# Patient Record
Sex: Male | Born: 1990 | Race: Black or African American | Hispanic: No | Marital: Single | Smoking: Never smoker
Health system: Southern US, Community
[De-identification: ages and names within clinical notes are randomized; demographics above are authoritative.]

## PROBLEM LIST (undated history)

## (undated) DIAGNOSIS — L8990 Pressure ulcer of unspecified site, unspecified stage: Secondary | ICD-10-CM

## (undated) DIAGNOSIS — G3182 Leigh's disease: Secondary | ICD-10-CM

## (undated) DIAGNOSIS — L89152 Pressure ulcer of sacral region, stage 2: Secondary | ICD-10-CM

## (undated) DIAGNOSIS — Q8789 Other specified congenital malformation syndromes, not elsewhere classified: Secondary | ICD-10-CM

## (undated) HISTORY — PX: HEMANGIOMA EXCISION: SHX1734

## (undated) HISTORY — PX: GASTROSTOMY TUBE PLACEMENT: SHX655

---
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
## 2009-07-25 ENCOUNTER — Ambulatory Visit: Payer: Self-pay | Admitting: General Surgery

## 2009-10-17 ENCOUNTER — Ambulatory Visit: Payer: Self-pay | Admitting: General Surgery

## 2009-10-31 ENCOUNTER — Ambulatory Visit (HOSPITAL_COMMUNITY): Admission: RE | Admit: 2009-10-31 | Discharge: 2009-10-31 | Payer: Self-pay | Admitting: Orthopedic Surgery

## 2010-03-26 ENCOUNTER — Emergency Department (HOSPITAL_COMMUNITY)
Admission: EM | Admit: 2010-03-26 | Discharge: 2010-03-26 | Payer: Self-pay | Source: Home / Self Care | Admitting: Family Medicine

## 2010-12-24 LAB — CBC
HCT: 45.5 % (ref 39.0–52.0)
Hemoglobin: 15.9 g/dL (ref 13.0–17.0)
MCHC: 34.9 g/dL (ref 30.0–36.0)
MCV: 88 fL (ref 78.0–100.0)
Platelets: 148 10*3/uL — ABNORMAL LOW (ref 150–400)
RBC: 5.17 MIL/uL (ref 4.22–5.81)
RDW: 12.9 % (ref 11.5–15.5)
WBC: 7.1 10*3/uL (ref 4.0–10.5)

## 2011-02-23 NOTE — Consult Note (Signed)
Jason Glover, Jason Glover            ACCOUNT NO.:  [id number]   MEDICAL RECORD NO.:  [id number]          PATIENT TYPE:  INP   LOCATION:  6150                         FACILITY:  [facility]   PHYSICIAN:  Genene Churn. Love, M.D.    DATE OF BIRTH:  10-18-1990   DATE OF CONSULTATION:  11/05/2005  DATE OF DISCHARGE:                                   CONSULTATION   REFERRING PHYSICIAN:  Orie Rout, M.D.   REASON FOR CONSULTATION:  This 20 year old right-handed African American  male is seen at the request of the Pediatric Service for evaluation of known  history of mitochondrial encephalopathy and altered mental status.   HISTORY OF PRESENT ILLNESS:  Jason Glover was the 9-pound 10-ounce  product of a full-term pregnancy, delivered by C-section at [facility]  for bradycardia.  Developmental motor milestones were normal to about 20 year  of age, according to his mom, but at 47 months, he began having episodes of  slumping while riding in the car with his mom.  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  He did not have any seizures.  He did not have any visual  loss.  He began having episodes occurring as often as once per year to once  every other year of lethargy, drooling, dysarthria, dysphagia, quadriparesis  with limp muscle lasting as long as weeks to months and associated with  shortness of breath.  Over the last year he has had a deterioration in his  neurologic skills with inability to stand, the development of paraparesis,  and the need for a wheelchair, but he has been able to dress himself, feed  himself, bath himself, and take care of his toilet needs.  He is mentally  retarded, but has attended special education school.   His mother noted about 2 weeks ago that he began going into another one of  his episodes with decreased speech, lethargy, drooping, dysarthria, double  vision and shortness of  breath and was admitted with dehydration, an  elevated serum sodium to 151 and low CO2 content.  In the hospital he  required large volumes of fluid, up to 7 L, for his dehydration with  improvement in his dehydration, but has remained lethargic with decreased  speech, drooling, dysarthria, having dysphagia and generalized weakness.  On  chest x-ray, he has been found to have a pneumomediastinum.  His last blood  studies on November 04, 2005 revealed a sodium of 140, potassium 3.2,  chloride 113, CO2 content 22, glucose 119, BUN 11, creatinine 0.7 and a  calcium of 8.8.  A urine culture grew 80,000 colonies per milliliter of  multiple bacterial species present.  Sed rate was elevated at 25 on November 02, 2005.  Initial white blood cell count was 16,100, hemoglobin was 17.3,  hematocrit was 50.7 and platelet count was 242,000.  His initial sodium was  151, as mentioned, which went from 126-107 and has gone to 140, and  potassium of 3.2, as mentioned.  His liver function  tests have remained  normal in the hospital.  His urine ketones were initially 40 and  subsequently have become negative.  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   His medications at home have included Carnitor, iron, mega B, Clarinex,  Singulair, Flonase, albuterol for treatment of his asthma and MiraLax.   In the hospital, he has been found to have evidence of oral candidiasis.  He  history received Diflucan.  Other medicines in the hospital include ferrous  sulfate, Atarax, multivitamin and lactated Ringer's.   PHYSICAL EXAMINATION:  GENERAL:  Examination revealed a well-developed, thin  male who was at this time afebrile.  VITAL SIGNS:  His blood pressure in the right arm was 120/60; left arm was  110/60.  Heart rate was 100.  NEUROLOGIC:  He was alert, would look at the examiner and follow with  his  eyes.  He tried occasionally to mumble words, would hold up 1 or 2 fingers  with his hands at times; otherwise, he lay in bed with generalized decreased  tone, but some increased tone and flexion at the elbows, knees and heel  cords.  As mentioned, he would follow occasional commands.  His cranial  nerve examination revealed that he would count fingers with each eye.  Both  disks were seen and flat. There was no optic atrophy.  The extraocular  movements were full and he had corneals.  He had bifacial weakness.  He did  have gags.  He could hear.  His motor examination revealed decreased gone,  except for the flexion of his elbow and his knees and ankles.  His plantar  responses were bilaterally upgoing, but he had absent reflexes.  He could  feel pinprick in his upper and lower extremities.  He had decreased  vibration sense in his lower extremities.   IMPRESSION:  1.  Metabolic encephalopathy with history of dehydration, code 348.3.  2.  Leigh's disease or mitochondrial encephalopathy.  3.  History of asthma.  4.  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  5.  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   PLAN:  Plan at this time is to obtain metabolic studies or parameters for  evaluation of his mental status and then consult Dr. Sharene Skeans regarding  consideration of repeat MRI, which is frequently abnormal with Leigh's  disease, showing white matter disease.  His last MRI according to his mother  was many years ago.  Overall, he has had a definite progression over the  last year as well as acute episodes occurring about once per year and his  prognosis is not good.           ______________________________  Genene Churn. Sandria Manly, M.D.     JML/MEDQ  D:  11/05/2005  T:  11/06/2005  Job:  [id number]

## 2011-02-23 NOTE — Discharge Summary (Signed)
NAMEELUZER, HOWDESHELL NO.:  [id number]   MEDICAL RECORD NO.:  [id number]          PATIENT TYPE:  INP   LOCATION:  6150                         FACILITY:  [facility]   PHYSICIAN:  Dyann Ruddle, MDDATE OF BIRTH:  10/05/1991   DATE OF ADMISSION:  11/02/2005  DATE OF DISCHARGE:  12/07/2005                                 DISCHARGE SUMMARY   DISCHARGE DIAGNOSES:  1.  Myopathy, encephalopathy, lactic acidosis, and stroke-like episodes      syndrome (MELAS).  2.  Leigh's syndrome.  3.  Status post Nissen fundoplication/gastrostomy tube November 30, 2005.  4.  Oral candidiasis.  5.  Dehydration.  6.  Pneumomediastinum.  7.  Scoliosis.  8.  Enterococcus urinary tract infection, status post 10-day antibiotic      course.   DISCHARGE MEDICATIONS:  1.  Coenzyme Q10 one tablet daily.  2.  Multivitamin with iron one tablet daily per G-tube.  3.  Mega-B vitamin one tablet daily.  4.  Flonase one to two sprays each nostril daily.  5.  Carnitine 300 mg t.i.d. via G-tube.  6.  Hydroxyzine 30 mg daily via G-tube.  7.  Claritin 10 mg daily via G-tube.  8.  Nystatin 100,000 units swish and swallow four times a day.  9.  Singulair 10 mg daily via G-tube.  10. Tylenol 325 mg q.4h. p.r.n. via G-tube.  11. Fluconazole 100 mg daily x14 days via G-tube.  12. Miralax one-half capful four times daily via G-tube.  13. Magic Mouthwash with Xylocaine 5 mL four times daily.   HOSPITAL PROCEDURES:  1.  Chest x-ray on November 02, 2005, showed a pneumomediastinum.  2.  KUB on November 02, 2005, showed scoliosis but no signs of obstruction.  3.  Chest x-ray on November 06, 2005, showed decrease in the size of      pneumomediastinum.  4.  November 06, 2005, MRI of the brain showed a tiny suspected acute stroke      in the left putamen.  5.  November 07, 2005, PICC line placed via ultrasound and fluoroscopy.  6.  November 08, 2005, modified barium swallow was incomplete because the  patient could not participate.  7.  On November 12, 2005, MRI of the C-spine showed no cord compression or      abnormality of the cord.  8.  On December 03, 2005, the chest x-ray showed no active lung disease.  9.  On November 19, 2005, a KUB showed a mild ileus.  10. On November 19, 2005, a transthoracic echo showed normal cardiac      function.  11. On November 22, 2005, a barium swallow and upper GI showed minimal      aspiration with significant gastroesophageal reflux.  12. On February 23, a G-tube and Nissen performed.   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  He presented with hypernatremic dehydration as well as  pharyngitis and oral candidiasis.  The patient had not been taking food  or  water at home and had an altered mental status for one day.  This was a  significant decline from the patient's baseline.  Since he would not take  p.o., TPN was initiated on November 07, 2005.  The patient's ambulation had  significantly decreased from his baseline, as did his verbalization.  Neurology was consulted and felt that this decline may be progression of his  underlying syndromes and the MRI of the brain did not explain these acute  changes.  The patient eventually had TPN stopped and had a G-tube and Nissen  placed on February 23.  At discharge the patient was nonambulatory but  followed simple commands and nodded yes or no to most questions.  Due to  his overall decline, it was felt best by his mother and staff to have him  discharged home with hospice services from [facility].  He was also to  continue oral fluconazole for two more weeks.  The plaques in his mouth have  resolved by the day of discharge.  The patient had been treated for a total  of 32 days of IV fluconazole while in the hospital.   DISCHARGE INSTRUCTIONS AND FOLLOW-UP:  1.  He is to  follow up with Dr. Sharene Skeans in approximately one month.  2.  He is to follow up with Dr. Leeanne Mannan in one week to evaluate his G-tube.  3.  He is to take all medications as prescribed.  4.  He will have hospice services and [facility] services providing support      for the mother and family.     ______________________________  Pediatrics Resident    ______________________________  Dyann Ruddle, MD    PR/MEDQ  D:  12/07/2005  T:  12/08/2005  Job:  [id number]

## 2011-02-23 NOTE — Op Note (Signed)
Jason Glover, Jason Glover            ACCOUNT NO.:  [id number]   MEDICAL RECORD NO.:  [id number]          PATIENT TYPE:  INP   LOCATION:  6152                         FACILITY:  [facility]   PHYSICIAN:  Leonia Corona, M.D.  DATE OF BIRTH:  02/21/1991   DATE OF PROCEDURE:  DATE OF DISCHARGE:                                 OPERATIVE REPORT   PREOPERATIVE DIAGNOSES:  1.  Severe gastroesophageal reflux.  2.  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  3.  Mitochondrial encephalopathy.   POSTOPERATIVE DIAGNOSES:  1.  Severe gastroesophageal reflux.  2.  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  3.  Mitochondrial encephalopathy.   PROCEDURE PERFORMED:  1.  Nissen fundoplication.  2.  Feeding gastrostomy.   SURGEON:  Leonia Corona, M.D.   ASSISTANTDonnella Bi D. Pendse, M.D.   ANESTHESIA:  General endotracheal tube anesthesia.   INDICATION FOR THE PROCEDURE:  This 20 year old male child was evaluated for  a possible gastrostomy tube placement.  The patient was known to have severe  mental disability, causing progressive deterioration of the mental status  caused by mitochondrial encephalopathy.  The patient has not been able to  cope with his nutritional requirement and had swallowing difficulties.  The  patient was also found to have severe gastroesophageal reflux, hence the  indication for the procedure.   PROCEDURE IN DETAIL:  The patient was brought into the operating room and  placed supine on the operating table.  General endotracheal tube anesthesia  was given.  The abdomen was cleaned, prepped and draped in the usual manner  from nipple to pubis and one lateral side to the other.  A midline incision  that started at the xiphoid and ran up to the umbilicus was made on the  skin.  The incision was deepened through the subcutaneous tissues using  electrocautery.  The abdomen was opened along the linea alba with cautery.  Self-retaining retractors were applied to expose the GE  junction.  The rib  cage appeared to be a very narrow angle and the GE junction a very high up.  There was a significant amount of difficulty in reaching up to the left lobe  of the liver.  We realized that it may not be easy to divide the triangular  ligament to fold the left lobe of the liver; we therefore decided to retract  the liver and expose the GE junction.  The stomach was pulled downwards and  the GE junction was exposed.  The peritoneal reflection over the lower  esophagus was picked up with forceps and divided with scissors.  The  peritoneal reflection over the anterior esophagus was divided and the left  side of the esophagus was then dissected with peanuts, creating a plane  between the esophagus and the crura of the esophageal hiatus.  Blunt and  sharp dissection was carried out until the GE junction was clear on the left  side.  Similar blunt and sharp dissection was carried to the right of the  esophagus until the right index finger could be passed behind the esophagus.  We then placed a half-inch Penrose drain behind the  esophagus at GE junction  and a hemostat was applied to the Penrose drain to help pull the esophagus  and while doing the dissection around it.  A small window behind the  esophagus was carefully enlarged with blunt and sharp dissection, exposing  the crura.  There was a very ill-defined, poorly formed left crus, where as  the right crus was clearly well-defined and developed.  Some more dissection  was carried out on the lesser curvature of the stomach, dividing the  neurovascular bundle between clamps and ligating it with 2-0 silk.  Further  dissection was carried out along the fundus, dividing first the short  gastric vessels with a Harmonic scalpel; for additional safety, metal clips  were applied on the splenic side of the vessels.  After fair mobilization of  the fundus and the posterior wall of the stomach, we made an assessment if  the fundus could  be wrapped around the esophagus by passing it behind the  esophagus.  With the help of fingers, the fundus was pushed behind the  esophagus and with the help of a Babcock, it was delivered on the right side  of the esophagus; 2 Babcocks were applied, one on the left and one of the  right side of the esophagus over the fundus of the stomach to assess the  wrap; a good assessment was made.  At this point, the folded fundus of the  stomach was pulled back and wound was irrigated.  Some more definition of  the left and right crura was done before putting a stitch; an ill-defined  left crus and a well-defined right crus were obvious at this time.  A size  38 bougie was passed by the anesthetist across the GE junction.  A stitch  was taken on the left and right crura using pledgeted 2-0 silk and tied over  the bougie.  A single stitch was placed to tighten the hiatus.  After tying  the hiatal stitch, the bougie was withdrawn and the mobilized fundus of the  stomach was pushed behind the retroesophageal window and delivered from the  right side of the esophagus.  Two Babcocks were applied, one on the left  side and one on the right side of the esophagus, over the stomach and first  stitch was placed at the highest point on the left side on the fundus.  A  bite was taken on the esophagus, then over the peritoneal reflection on the  diaphragm and then on the right side of the fundus.  A pledget was inserted  through the needle before tying.  The second stitch was taken about a  centimeter below the first by starting from the portion of the stomach on  the left side of the esophagus, a bite on the esophagus and then on the  fundus on the right side of the esophagus, also a pledget was placed.  A  third stitch was 1 cm below the second starting from the portion of the  fundus on the left side of the esophagus, taking a bite at the GE junction  and then the folded fundus on the right side of the  esophagus.  A pledget was fed through the needle and then the stitches were tied.  About a 2-cm-  long 360-degree full wrap was obtained.  The third stitches were cut.  The  wound was irrigated; no active bleeders were noted.  At this point, the  retractors were removed and a site was chosen  on the anterior wall of the  stomach closer to the greater curvature.  A stay suture using 3-0 silk was  placed to hold the stomach up.  Two pursestring sutures were placed on the  anterior stomach wall, enclosing about a 1-cm circle in the center.  The  second pursestring suture was about 2 mm outside the inner circle and  tagged.  A small skin incision was made at the proposed gastrostomy site on  the abdominal wall in the left upper quadrant.  Tonsil forceps were placed  through the abdominal wall and into the abdomen and another hemostat was  passed from inside the abdomen to deliver the tip outside the abdominal wall  to grasp the 18-French gastrostomy tube, which was pulled through the  abdominal wall.  The tip was delivered inside the abdomen.  At this point, a  small opening was made on the anterior wall of the stomach in the center of  the pursestring suture using cautery.  The well-lubricated gastrostomy tube  already flush with the abdominal wall was introduced into the stomach and  balloon was inflated to 5 mL of saline.  The inner pursestring suture was  tied followed by the outer pursestring suture being tied and closing the  cuff of the stomach wall over the gastrostomy tube.  The stomach was then  tacked to the anterior abdominal wall using 3-0 silk; 3 stitches were  placed, 1 laterally and 2, right inferior and right superior, and these  stitches were tied while simultaneously the gastrotomy tube was pulled  outside on the abdominal wall, bringing the balloon, abutting the stomach  wall.  After tying these stitches, the stomach was well-anchored to the  parietal peritoneum.  The stomach  was filled with saline, no leak was noted  and the fluid was suctioned out.  Wound was irrigated once again.  The GE  junction was inspected for any acute bleeding or oozing; none was noted.  At  this point, the bougie was withdrawn and a nasogastric tube was placed,  confirmed by palpation, and abdomen was closed in a single layer using 2-0  Vicryl interrupted fashion.  Approximately 20 mL of 0.25% Marcaine with  epinephrine were infiltrated in and around the incision for postoperative  pain control.  The  skin was approximated using skin metal clips.  Wound was clean and dried.  A  sterile gauze dressing was applied.  The patient tolerated the procedure  well, which was smooth and uneventful.  The patient was later extubated and  transported to the recovery room in good and stable condition.      Leonia Corona, M.D.  Electronically Signed     SF/MEDQ  D:  11/30/2005  T:  12/01/2005  Job:  [id number]   cc:   Orie Rout, M.D.  Fax: [phone]

## 2011-02-23 NOTE — Op Note (Signed)
[name], Jason Glover            ACCOUNT NO.:  [id number]   MEDICAL RECORD NO.:  [id number]          PATIENT TYPE:  AMB   LOCATION:  ENDO                         FACILITY:  [facility]   PHYSICIAN:  Leonia Corona, M.D.  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   PROCEDURE:  Replacement of feeding gastrostomy with G-button.  The procedure  was performed in the endoscopy suite without anesthesia.   SURGEON:  Leonia Corona, M.D.   ASSISTANT:  Nurse.   INDICATIONS FOR PROCEDURE:  This 20 year old male child was operated a month  and a half ago for severe nutritional failure requiring a Nissen and feeding  gastrostomy.  The patient still has a surgically placed long gastrostomy  tube which needs to be replaced with G-button for convenient using.  Hence,  the indication for the procedure.   DESCRIPTION OF PROCEDURE:  The patient was placed supine on the bed in the  endoscopy suite.  The old gastrostomy tube was removed by deflating the  balloon.  It was pulled out without difficulty.  A new MIC G-button 18  French 2.7-cm stem was well-lubricated and introduced through the  gastrotomy, and the balloon was inflated to 4 ccH20.  It fitted very well.  The correct placement was confirmed by instilling water into the stomach.  We had a free flow and return of gastric content, confirming correct  placement.   The patient tolerated the procedure very well which was smooth and  uneventful.  The patient was later discharged to home with instructions to  follow routine care and use of gastrostomy button and follow up p.r.n.      Leonia Corona, M.D.  Electronically Signed     SF/MEDQ  D:  01/10/2006  T:  01/11/2006  Job:  [id number]   cc:   [place], M.D.  Fax: [phone]

## 2015-07-21 ENCOUNTER — Ambulatory Visit: Payer: Self-pay | Admitting: Physical Therapy

## 2015-08-03 ENCOUNTER — Emergency Department (HOSPITAL_COMMUNITY)
Admission: EM | Admit: 2015-08-03 | Discharge: 2015-08-03 | Disposition: A | Discharge status: Home / Self Care | Payer: BLUE CROSS/BLUE SHIELD | Attending: Emergency Medicine | Admitting: Emergency Medicine

## 2015-08-03 ENCOUNTER — Encounter (HOSPITAL_COMMUNITY): Payer: Self-pay | Admitting: Emergency Medicine

## 2015-08-03 DIAGNOSIS — T85598A Other mechanical complication of other gastrointestinal prosthetic devices, implants and grafts, initial encounter: Secondary | ICD-10-CM | POA: Insufficient documentation

## 2015-08-03 DIAGNOSIS — K9423 Gastrostomy malfunction: Secondary | ICD-10-CM

## 2015-08-03 DIAGNOSIS — Z79899 Other long term (current) drug therapy: Secondary | ICD-10-CM | POA: Insufficient documentation

## 2015-08-03 DIAGNOSIS — K9429 Other complications of gastrostomy: Secondary | ICD-10-CM

## 2015-08-03 DIAGNOSIS — Y733 Surgical instruments, materials and gastroenterology and urology devices (including sutures) associated with adverse incidents: Secondary | ICD-10-CM | POA: Insufficient documentation

## 2015-08-03 MED ORDER — LIDOCAINE HCL 2 % EX GEL
1.0000 "application " | Freq: Once | CUTANEOUS | Status: AC
  Administered 2015-08-03: 1 via TOPICAL
  Filled 2015-08-03: qty 20

## 2015-08-03 NOTE — ED Provider Notes (Signed)
CSN: [id number]     Arrival date & time 08/03/15  1455 History   First MD Initiated Contact with Patient 08/03/15 1653     Chief Complaint  Patient presents with  . g tube issue   . Back Pain   HPI   24 year old male with a history of metabolic encephalopathy with quadriparesis, status post G-tube placement presents today with G-tube complication. Mother reports that she regularly changes his G-tube at home and has done so for the past 9 years. She reports that over the last several weeks she's been unable to remove the G-tube on her own. She attempted following up with her pediatric surgeon who placed it, he is no longer in practice ( Dr. Wyline MoodWeiner). She is currently in contact with [facility] who will see the patient after appropriate paperwork is in place [facility]. She reports that everything is in order for patient to be seen but has to call in 2 days to schedule the evaluation. Mother reports the patient is able to tolerate some nutrition by mouth but does not believe it's enough to maintain adequate nutrition. She denies any signs of infection surrounding the stoma, no discharge, odor, or abnormal swelling. Denies any fever, chills, nausea, vomiting, diarrhea. Mother also reports that patient has been complaining of back pain, she reports this is chronic in nature, due to his persistent weakness and inability to move himself.    No past medical history on file. No past surgical history on file. No family history on file. Social History  Substance Use Topics  . Smoking status: Never Smoker   . Smokeless tobacco: Not on file  . Alcohol Use: No    Review of Systems  All other systems reviewed and are negative.   Allergies  Review of patient's allergies indicates no known allergies.  Home Medications   Prior to Admission medications   Medication Sig Start Date End Date Taking? Authorizing Provider  B Complex-Biotin-FA (B COMPLETE) TABS Take 1 tablet by  mouth daily.   Yes Historical Provider, MD  ferrous sulfate 325 (65 FE) MG EC tablet Take 325 mg by mouth daily with breakfast.   Yes Historical Provider, MD  LevOCARNitine (L-CARNITINE) 250 MG CAPS Take 250 mg by mouth daily.   Yes Historical Provider, MD  polyethylene glycol (MIRALAX / GLYCOLAX) packet Take 17 g by mouth every other day.   Yes Historical Provider, MD   BP 145/97 mmHg  Pulse 88  Temp(Src) 98.1 F (36.7 C) (Oral)  Resp 16  SpO2 98%   Physical Exam  Constitutional: He is oriented to person, place, and time. He appears well-developed and well-nourished.  Poorly nourished  HENT:  Head: Normocephalic and atraumatic.  Eyes: Conjunctivae are normal. Pupils are equal, round, and reactive to light. Right eye exhibits no discharge. Left eye exhibits no discharge. No scleral icterus.  Neck: Normal range of motion. No JVD present. No tracheal deviation present.  Pulmonary/Chest: Effort normal. No stridor.  Abdominal: Soft. He exhibits no distension and no mass. There is no tenderness. There is no rebound and no guarding.  Left lower abdomen G-tube in place, stoma free of signs of infection including redness, discharge, odor, tenderness to palpation  Neurological: He is alert and oriented to person, place, and time. Coordination normal.  Skin: Skin is warm and dry.  Psychiatric: He has a normal mood and affect. His behavior is normal. Judgment and thought content normal.  Nursing note and vitals reviewed.   ED  Course  Procedures (including critical care time)  Gastric tube removal and placement   Labs Review Labs Reviewed - No data to display  Imaging Review No results found. I have personally reviewed and evaluated these images and lab results as part of my medical decision-making.   EKG Interpretation None      MDM   Final diagnoses:  Gastrostomy tube obstruction [facility])    Labs:  Imaging:  Consults:  Therapeutics:  Discharge Meds:   Assessment/Plan:  Patient presents with difficulties with G-tube. Aspiration of the balloon shows fluid and ground medicine. Most likely mother accidentally placed for fluid into the balloon, which made it difficult to reduce balloon size. G-tube was easily removed after deflation of the balloon. The tube was placed, 17 [place], flushed with 40 mL of normal saline, without complication. Patient will be discharged home with instructions to continue follow-up with primary care provider. If any new or worsening signs or symptoms present mother is instructed to bring patient back further evaluation and management.         Eyvonne Mechanic, PA-C 08/03/15 1827  Cathren Laine, MD 08/03/15 [phone]

## 2015-08-03 NOTE — Discharge Instructions (Signed)
Gastrostomy Tube Home Guide, Adult °A gastrostomy tube is a tube that is surgically placed into the stomach. It is also called a "G-tube." G-tubes are used when a person is unable to eat and drink enough on their own to stay healthy. The tube is inserted into the stomach through a small cut (incision) in the skin. This tube is used for: °· Feeding. °· Giving medication. °GASTROSTOMY TUBE CARE °· Wash your hands with soap and water. °· Remove the old dressing (if any). Some styles of G-tubes may need a dressing inserted between the skin and the G-tube. Other types of G-tubes do not require a dressing. Ask your health care provider if a dressing is needed. °· Check the area where the tube enters the skin (insertion site) for redness, swelling, or pus-like (purulent) drainage. A small amount of clear or tan liquid drainage is normal. Check to make sure scar tissue (skin) is not growing around the insertion site. This could have a raised, bumpy appearance. °· A cotton swab can be used to clean the skin around the tube: °¨ When the G-tube is first put in, a normal saline solution or water can be used to clean the skin. °¨ Mild soap and warm water can be used when the skin around the G-tube site has healed. °¨ Roll the cotton swab around the G-tube insertion site to remove any drainage or crusting at the insertion site. °STOMACH RESIDUALS °Feeding tube residuals are the amount of liquids that are in the stomach at any given time. Residuals may be checked before giving feedings, medications, or as instructed by your health care provider. °· Ask your health care provider if there are instances when you would not start tube feedings depending on the amount or type of contents withdrawn from the stomach. °· Check residuals by attaching a syringe to the G-tube and pulling back on the syringe plunger. Note the amount, and return the residual back into the stomach. °FLUSHING THE G-TUBE °· The G-tube should be periodically  flushed with clean warm water to keep it from clogging. °¨ Flush the G-tube after feedings or medications. Draw up 30 mL of warm water in a syringe. Connect the syringe to the G-tube and slowly push the water into the tube. °¨ Do not push feedings, medications, or flushes rapidly. Flush the G-tube gently and slowly. °¨ Only use syringes made for G-tubes to flush medications or feedings. °¨ Your health care provider may want the G-tube flushed more often or with more water. If this is the case, follow your health care provider's instructions. °FEEDINGS °Your health care provider will determine whether feedings are given as a bolus (a certain amount given at one time and at scheduled times) or whether feedings will be given continuously on a feeding pump.  °· Formulas should be given at room temperature. °· If feedings are continuous, no more than 4 hours worth of feedings should be placed in the feeding bag. This helps prevent spoilage or accidental excess infusion. °· Cover and place unused formula in the refrigerator. °· If feedings are continuous, stop the feedings when medications or flushes are given. Be sure to restart the feedings. °· Feeding bags and syringes should be replaced as instructed by your health care provider. °GIVING MEDICATION  °· In general, it is best if all medications are in a liquid form for G-tube administration. Liquid medications are less likely to clog the G-tube. °¨ Mix the liquid medication with 30 mL (or amount recommended by   your health care provider) of warm water. °¨ Draw up the medication into the syringe. °¨ Attach the syringe to the G-tube and slowly push the mixture into the G-tube. °¨ After giving the medication, draw up 30 mL of warm water in the syringe and slowly flush the G-tube. °· For pills or capsules, check with your health care provider first before crushing medications. Some pills are not effective if they are crushed. Some capsules are sustained-release  medications. °¨ If appropriate, crush the pill or capsule and mix with 30 mL of warm water. Using the syringe, slowly push the medication through the tube, then flush the tube with another 30 mL of tap water. °G-TUBE PROBLEMS °G-tube was pulled out. °· Cause: May have been pulled out accidentally. °· Solutions: Cover the opening with clean dressing and tape. Call your health care provider right away. The G-tube should be put in as soon as possible (within 4 hours) so the G-tube opening (tract) does not close. The G-tube needs to be put in at a health care setting. An X-ray needs to be done to confirm placement before the G-tube can be used again. °Redness, irritation, soreness, or foul odor around the gastrostomy site. °· Cause: May be caused by leakage or infection. °· Solutions: Call your health care provider right away. °Large amount of leakage of fluid or mucus-like liquid present (a large amount means it soaks clothing). °· Cause: Many reasons could cause the G-tube to leak. °· Solutions: Call your health care provider to discuss the amount of leakage. °Skin or scar tissue appears to be growing where tube enters skin.  °· Cause: Tissue growth may develop around the insertion site if the G-tube is moved or pulled on excessively. °· Solutions: Secure tube with tape so that excess movement does not occur. Call your health care provider. °G-tube is clogged. °· Cause: Thick formula or medication. °· Solutions: Try to slowly push warm water into the tube with a large syringe. Never try to push any object into the tube to unclog it. Do not force fluid into the G-tube. If you are unable to unclog the tube, call your health care provider right away. °TIPS °· Head of bed (HOB) position refers to the upright position of a person's upper body. °¨ When giving medications or a feeding bolus, keep the HOB up as told by your health care provider. Do this during the feeding and for 1 hour after the feeding or medication  administration. °¨ If continuous feedings are being given, it is best to keep the HOB up as told by your health care provider. When ADLs (activities of daily living) are performed and the HOB needs to be flat, be sure to turn the feeding pump off. Restart the feeding pump when the HOB is returned to the recommended height. °· Do not pull or put tension on the tube. °· To prevent fluid backflow, kink the G-tube before removing the cap or disconnecting a syringe. °· Check the G-tube length every day. Measure from the insertion site to the end of the G-tube. If the length is longer than previous measurements, the tube may be coming out. Call your health care provider if you notice increasing G-tube length. °· Oral care, such as brushing teeth, must be continued. °· You may need to remove excess air (vent) from the G-tube. Your health care provider will tell you if this is needed. °· Always call your health care provider if you have questions or problems with the   G-tube. °SEEK IMMEDIATE MEDICAL CARE IF:  °· You have severe abdominal pain, tenderness, or abdominal bloating (distension). °· You have nausea or vomiting. °· You are constipated or have problems moving your bowels. °· The G-tube insertion site is red, swollen, has a foul smell, or has yellow or brown drainage. °· You have difficulty breathing or shortness of breath. °· You have a fever. °· You have a large amount of feeding tube residuals. °· The G-tube is clogged and cannot be flushed. °MAKE SURE YOU:  °· Understand these instructions. °· Will watch your condition. °· Will get help right away if you are not doing well or get worse. °  °This information is not intended to replace advice given to you by your health care provider. Make sure you discuss any questions you have with your health care provider. °  °Document Released: 12/03/2001 Document Revised: 02/08/2015 Document Reviewed: 06/01/2013 °[employer]vier Interactive Patient Education ©2016 [employer] ° °

## 2015-08-03 NOTE — ED Notes (Signed)
Pt here with mother c/o gtube not working x 3 weeks and pt having increasing generalized weakness due to not able to take in enough PO; pt sts lower back pain

## 2015-09-08 ENCOUNTER — Encounter: Payer: Self-pay | Admitting: Physical Therapy

## 2015-09-08 ENCOUNTER — Ambulatory Visit: Payer: BLUE CROSS/BLUE SHIELD | Attending: Pediatrics | Admitting: Physical Therapy

## 2015-09-08 DIAGNOSIS — R293 Abnormal posture: Secondary | ICD-10-CM | POA: Diagnosis present

## 2015-09-08 NOTE — Therapy (Signed)
[facility] [address] [place], [place], [address] Phone: [phone]   Fax:  [phone]  Physical Therapy Evaluation  Patient Details  Name: Jason Glover. MRN: [id number] Date of Birth: 26-Jan-1991 No Data Recorded  Encounter Date: 09/08/2015      PT End of Session - 09/08/15 1015    Visit Number 1   Number of Visits 1   PT Start Time 0915   PT Stop Time 1023   PT Time Calculation (min) 68 min   Activity Tolerance Patient tolerated treatment well   Behavior During Therapy [facility] for tasks assessed/performed      History reviewed. No pertinent past medical history.  History reviewed. No pertinent past surgical history.  There were no vitals filed for this visit.  Visit Diagnosis:  Posture abnormality      Subjective Assessment - 09/08/15 0915    Subjective Patient presents for PT evaluation to assess power w/c and justify a power stander.        Mobility/Seating Evaluation    PATIENT INFORMATION: Name: Jason Glover DOB: September 04, 1991  Sex: Male Date seen: 09/08/2015 Time: 8:45  Address:  [address] [place] [address] Physician: Jason Carwin, MD This evaluation/justification form will serve as the LMN for the following suppliers: __________________________ Supplier: [employer] Contact Person: Jason Glover, Burt Knack Phone:  [phone]   Seating Therapist: Vladimir Glover, PT, DPT Phone:   [phone]   Phone: [phone]    Spouse/Parent/Caregiver name: Jason Glover, mother Phone number: [phone] Insurance/Payer: [employer]equiel [employer] & Medicaid [place]     Reason for Referral: Jason Glover Stand Assist  Patient/Caregiver Goals: To enable him to go to bathroom, stretching, pressure relief  Patient was seen for face-to-face evaluation for new power wheelchair.  Also present was Jason Glover, ATP, Jason Glover, mother to discuss recommendations and wheelchair options.  Further  paperwork was completed and sent to vendor.  Patient appears to qualify for power mobility device at this time per objective findings.   MEDICAL HISTORY: Diagnosis: Primary Diagnosis: Rett's Syndrome with quadriparesis, Metabolic Encephalopathy Onset: Rett's Syndrome congential, by 24yo was non-ambulatory    Progressive Disease Relevant past and future surgeries: Feeding tube placement 2010,    Height: 6' Weight: 130# Explain recent changes or trends in weight: lost 5# over last 2 months   History including Falls: none    HOME ENVIRONMENT: House  Condo/town home  Apartment  Assisted Living    Lives Alone  Lives with Others                                                                                          Hours with caregiver: 24hrs/day  Home is accessible to patient           Stairs      Yes  No     Ramp Yes No Comments:  He lives with his mother in single level house with ramped entrance   COMMUNITY ADL: TRANSPORTATION: Car    Van    Public Transportation    Adapted w/c Lift    Ambulance    Other:         Sits in wheelchair during transport  Employment/School: [employer]   FUNCTIONAL/SENSORY PROCESSING SKILLS:  Handedness:   Right     Left    NA  Comments:  ?????  Functional Processing Skills for [facility] Processing Skills are adequate for safe wheelchair operation   VERBAL COMMUNICATION: WFL receptive  WFL expressive Understandable  Difficult to understand  non-communicative  Uses an augmented communication device  CURRENT SEATING / MOBILITY: Current Mobility Base:  None Dependent Manual Scooter Power  Type of Control: ?????    Describe posture in present seating system:  Sacral seating with left pelvic obliquity, right pelvic forward rotation with increased wt on left hip, kypothic,       SENSATION and SKIN ISSUES: Sensation Intact  Impaired Absent   Level of sensation: left side appears absent below waist, deminished from shoulders distally Pressure Relief: Able to perform effective pressure relief :    Yes   No Method: ???? If not, Why?: power w/c has power tilt & relcine but patient is fearful of falling so will not stay in position long enough to have pressure relief  Skin Issues/Skin Integrity Current Skin Issues  Yes No Intact  Red area[]  Open Area  Scar Tissue At risk from prolonged sitting Where  ?????  History of Skin Issues  Yes No Where  sacrum When  2008  Hx of skin flap surgeries  Yes No Where  ????? When  ?????  Limited sitting tolerance Yes No Hours spent sitting in wheelchair daily: Up to 6 hours  Complaint of Pain:  Please describe: left hip & back which onsets ~2 hours sitting in w/c. Mother reports non-verbal expressions ~8/10. His condition limits ability to feel lower levels of pain indicating need to move.   Swelling/Edema: none   ADL STATUS (in reference to wheelchair use):  Indep Assist Unable Indep with Equip Not assessed Comments  Dressing ????? ????? X ????? ????? lower body in hospital bed, upper body in w/c  Eating ????? ????? ????? X ????? Sitting in w/c with tenodesis splint  Toileting ????? ????? X ????? ????? uses diaper briefs  Bathing ????? ????? X ????? ????? sponge bath in bed, bath chair if pain is not limiting sitting  Grooming/Hygiene ????? X ????? ????? ????? Mother uses hand over hand assist in w/c  Meal Prep ????? ????? X ????? ????? ?????  IADLS ????? ????? X ????? ????? ?????  Bowel Management: Continent  Incontinent  Accidents Comments:  ?????  Bladder Management: Continent  Incontinent  Accidents Comments:  ?????     WHEELCHAIR SKILLS: Operate Power w/c:   Std. Joystick    Alternative Controls Indep  Assist  Dependent/unable  N/A   Safe           Functional      Distance: ?????  Bed confined without  wheelchair  Yes  No   STRENGTH/RANGE OF MOTION:  passive Range of Motion Strength  Shoulder Full ROM 3-/5 with ataxia bilateral  Elbow -35 deg elbow extension on R, -23 deg elbow extension on L triceps 3-/5 on L, 3/5 all other motions in LUE  Wrist/Hand WFL 3-/5  Hip -23 deg of L hip flex, -18 deg of R hip flex via Thomas test in supine L and R hip flex 3-/5, L and R knee ext 2-/5  Knee R knee ext -26 deg, L knee ext -16  3-/5 bilateral extension  Ankle -15 deg eversion R, DF -33 deg R, L gets to neutral,  L DF -35 deg non functional     MOBILITY/BALANCE:  [x]  Patient is totally dependent for mobility  ?????    Balance Transfers Ambulation  Sitting Balance: Glover Balance: []  Independent []  Independent/Modified Independent  []  WFL     []  WFL []  Supervision []  Supervision  [x]  Uses UE for balance  []  Supervision []  Min Assist []  Ambulates with Assist  ?????    []  Min Assist []  Min assist []  Mod Assist []  Ambulates with Device:      []  RW  []  StW  []  Cane  []  ?????  []  Mod Assist []  Mod assist []  Max assist   []  Max Assist []  Max assist [x]  Dependent []  Indep. Short Distance Only  []  Unable [x]  Unable []  Lift / Sling Required Distance (in feet)  ?????   []  Sliding board [x]  Unable to Ambulate (see explanation below)  Cardio Status:  [x] Intact  []  Impaired   []  NA     ?????  Respiratory Status:  [x] Intact   [] Impaired   [] NA     ?????  Orthotics/Prosthetics: none  Comments (Address manual vs power w/c vs scooter): Mother performs total assist lift transfers with patient wrapping UE around her shoulders / upper back and mother lifting by waist of his pants.         Anterior / Posterior Obliquity Rotation-Pelvis ?????  PELVIS    []  []  [x]   Neutral Posterior Anterior  []  [x]  []   WFL Rt elev Lt elev  []  [x]  []   WFL Right Left                      Anterior    Anterior     []  Fixed []  Other [x]  Partly Flexible []  Flexible   [x]  Fixed []  Other []  Partly Flexible  []  Flexible   []  Fixed []  Other [x]  Partly Flexible  []  Flexible   TRUNK  []  [x]  []   WFL  Thoracic  Lumbar  Kyphosis Lordosis  []  [x]  []   WFL Convex Convex  Right Left [x] c-curve [] s-curve [] multiple  [x]  Neutral []  Left-anterior []  Right-anterior     []  Fixed []  Flexible [x]  Partly Flexible []  Other  [x]  Fixed []  Flexible []  Partly Flexible []  Other  []  Fixed             [x]  Flexible []  Partly Flexible []  Other    Position Windswept  ?????  HIPS          []            []               []    Neutral       Abduct        ADduct         []           []            [x]   Neutral Right           Left      []  Fixed []  Subluxed []  Partly Flexible []  Dislocated []  Flexible  []  Fixed []  Other []  Partly Flexible  [x]  Flexible                 Foot Positioning Knee Positioning  See ROM section for ankle position    [x]  WFL  [] Lt [] Rt []  WFL  [] Lt [] Rt    KNEES ROM concerns: ROM concerns:    & Dorsi-Flexed [] Lt [] Rt Extension limitations-  See ROM section    FEET Plantar Flexed [x] Lt [x] Rt      Inversion                 [x] Lt [x] Rt      Eversion                 [] Lt [] Rt     HEAD [x]  Functional [x]  Good Head Control  ?????  & []  Flexed         []  Extended []  Adequate Head Control    NECK []  Rotated  Lt  []  Lat Flexed Lt []  Rotated  Rt []  Lat Flexed Rt []  Limited Head Control     []  Cervical Hyperextension []  Absent  Head Control     SHOULDERS ELBOWS WRIST& HAND ?????      Left     Right    Left     Right    Left     Right   U/E [x] Functional           [x] Functional [facility] WFl [] Fisting             [] Fisting      [] elev   [] dep      [] elev   [] dep       [] pro -[] retract     [] pro  [] retract [] subluxed             [] subluxed           Goals for Seating system [x]  Optimize pressure distribution [x]  Provide support needed to facilitate function or safety [x]  Provide corrective forces to assist with maintaining or improving posture [x]  Accommodate client's posture:   current seated postures  and positions are not flexible or will not tolerate corrective forces [x]  Client to be independent with relieving pressure in the wheelchair [x] Enhance physiological function such as breathing, swallowing, digestion   MOBILITY BASE RECOMMENDATIONS and JUSTIFICATION: MOBILITY COMPONENT JUSTIFICATION  [x]  Back  []  Angle Adjustable []  Custom molded E2620 Jay 3 Posterior Deep Lateral Back, 6" Contour fixed lateral supports-16" wide by 21" height [x] postural control [] control of tone/spasticity [x] accommodation of range of motion [x] UE functional control [x] accommodation for seating system [x]  Current back system is pushing patient forward and facilitating poor posture & balance.  When he reclines, the back push his pelvis forward, shears lumbar / thoracic region and shifts lumbar support into his lower thoracic region. Due to these issues, patient will only recline ~20* and for only short periods. When he tilts the w/c, the patient feels insecure (like he is being pushed out of the w/c). The posterior support also is not properly positioned  in alignment with his lumbar/ thoracic region pushing his back & pelvis into abnormal, uncomfortable positions.  [x] provide lateral trunk support [x] accommodate deformity [x] provide posterior trunk support [x] provide lumbar/sacral support [x] support trunk in midline [x] Pressure relief over spinal processes  [x]  Seat Cushion E2609: [employer] engineer Cushion-Anit-thrust with built up pomel, and adduction walls. Memory foam ischial cutout, with 1/2" extra soft topper layer. Incontinence Cover. 16" wide by 19" deep.  [x] impaired sensation  [] decubitus ulcers present [x] history of pressure ulceration [x] prevent pelvic extension [x] low maintenance  [x] stabilize pelvis  [] accommodate obliquity [x] accommodate multiple deformity [x] neutralize lower extremity position [x] increase pressure distribution [x]  Current molded custom cushion no  longer matches patient's anatomy / deformities. He weight shifts to side to become comfortable increasing pressure in smaller, lateral area of hip / pelvis. Cushion facilitates poor lower extremity positioning.  The above equipment has a life- long use expectancy. Growth and changes in medical and/or functional conditions would be the exceptions. This is to certify that the therapist has no financial relationship with durable medical provider or manufacturer. The therapist will not receive remuneration of any kind for the equipment recommended in this evaluation.   Patient has mobility limitation that significantly impairs safe, timely participation in one or more mobility related ADL's.  (bathing, toileting, feeding, dressing, grooming, moving from room to room)                                                             [x]  Yes []  No Will mobility device sufficiently improve ability to participate and/or be aided in participation of MRADL's?         [x]  Yes []  No Can limitation be compensated for with use of a cane or walker?                                                                                []  Yes [x]  No Does patient or caregiver demonstrate ability/potential ability & willingness to safely use the mobility device?   [x]  Yes []  No Does patient's home environment support use of recommended mobility device?                                                    [x]  Yes []  No Does patient have sufficient upper extremity function necessary to functionally propel a manual wheelchair?    []  Yes [x]  No Does patient have sufficient strength and trunk stability to safely operate a POV (scooter)?                                  []  Yes [x]  No Does patient need additional features/benefits provided by a power wheelchair for MRADL's in the home?       [x]  Yes []  No Does the patient demonstrate the ability to safely use a power wheelchair?                                                               [x]  Yes []  No  Therapist Name Printed: Jason Glover, PT, DPT Date: 09/08/2015  Therapist's Signature:   Date:   Supplier's Name Printed: Jason Glover, [employer] Date: 09/08/2015  Supplier's Signature:   Date:  Patient/Caregiver Signature:   Date:     This is to certify that I have read this evaluation and do agree with the content within:    Physician's Name Printed: Jason Carwin, MD  Physician's Signature:  Date:     This is to certify that I, the above signed therapist have the following affiliations: []  This DME provider []  Manufacturer of recommended equipment []  Patient's long term care facility [x]  None of the above                                       Plan - 09/08/15 1230    Clinical Impression Statement Jason Glover does not appear that his ankles & feet are able to be positioned to enable safe weight bearing in a stander. With PT he may benefit from positioning orthosis to stretch ankles to permit weight bearing in the future. His cuurent seating system no longer supports or fits his postural issues / contractures. He is unable to perform pressure relief himself. He should use the power tilt and recline to perfrom pressures relief. But current seating system of back & buttock cushion prohibits use of tilt or recline.    Pt will benefit from skilled therapeutic intervention in order to improve on the following deficits Postural dysfunction   PT Frequency One time visit   Recommended Other Services Patient may benefit from PT to assess orthoses for LE positioning and teach mother stretching program.    Consulted and Agree with Plan of Care Patient;Family member/caregiver   Family Member Consulted mother, Jason Glover         Problem List There are no active problems to display for this patient.   Jason Glover PT, DPT 09/08/2015, 3:01 PM  [facility] [address] [place], [place], [address] Phone: [phone]   Fax:  [phone]  Name: Jason Glover. MRN: [id number] Date of Birth: April 18, 1991

## 2018-09-25 ENCOUNTER — Inpatient Hospital Stay (HOSPITAL_COMMUNITY): Payer: Medicaid Other

## 2018-09-25 ENCOUNTER — Inpatient Hospital Stay (HOSPITAL_COMMUNITY)
Admission: AD | Admit: 2018-09-25 | Discharge: 2018-10-17 | DRG: 100 | Disposition: A | Discharge status: Home Health | Payer: Medicaid Other | Source: Other Acute Inpatient Hospital | Attending: Internal Medicine | Admitting: Internal Medicine

## 2018-09-25 ENCOUNTER — Encounter (HOSPITAL_COMMUNITY): Payer: Self-pay | Admitting: Internal Medicine

## 2018-09-25 DIAGNOSIS — R402133 Coma scale, eyes open, to sound, at hospital admission: Secondary | ICD-10-CM | POA: Diagnosis present

## 2018-09-25 DIAGNOSIS — K5901 Slow transit constipation: Secondary | ICD-10-CM | POA: Diagnosis not present

## 2018-09-25 DIAGNOSIS — G3182 Leigh's disease: Secondary | ICD-10-CM

## 2018-09-25 DIAGNOSIS — L89521 Pressure ulcer of left ankle, stage 1: Secondary | ICD-10-CM | POA: Diagnosis present

## 2018-09-25 DIAGNOSIS — A419 Sepsis, unspecified organism: Secondary | ICD-10-CM | POA: Diagnosis not present

## 2018-09-25 DIAGNOSIS — Z4659 Encounter for fitting and adjustment of other gastrointestinal appliance and device: Secondary | ICD-10-CM

## 2018-09-25 DIAGNOSIS — R509 Fever, unspecified: Secondary | ICD-10-CM | POA: Diagnosis present

## 2018-09-25 DIAGNOSIS — T424X5A Adverse effect of benzodiazepines, initial encounter: Secondary | ICD-10-CM | POA: Diagnosis not present

## 2018-09-25 DIAGNOSIS — R402233 Coma scale, best verbal response, inappropriate words, at hospital admission: Secondary | ICD-10-CM | POA: Diagnosis present

## 2018-09-25 DIAGNOSIS — R9431 Abnormal electrocardiogram [ECG] [EKG]: Secondary | ICD-10-CM | POA: Diagnosis not present

## 2018-09-25 DIAGNOSIS — Z79899 Other long term (current) drug therapy: Secondary | ICD-10-CM

## 2018-09-25 DIAGNOSIS — R7989 Other specified abnormal findings of blood chemistry: Secondary | ICD-10-CM | POA: Diagnosis not present

## 2018-09-25 DIAGNOSIS — L89152 Pressure ulcer of sacral region, stage 2: Secondary | ICD-10-CM | POA: Diagnosis present

## 2018-09-25 DIAGNOSIS — R569 Unspecified convulsions: Secondary | ICD-10-CM | POA: Diagnosis not present

## 2018-09-25 DIAGNOSIS — Z881 Allergy status to other antibiotic agents status: Secondary | ICD-10-CM

## 2018-09-25 DIAGNOSIS — G809 Cerebral palsy, unspecified: Secondary | ICD-10-CM | POA: Diagnosis present

## 2018-09-25 DIAGNOSIS — Z681 Body mass index (BMI) 19 or less, adult: Secondary | ICD-10-CM | POA: Diagnosis not present

## 2018-09-25 DIAGNOSIS — L89003 Pressure ulcer of unspecified elbow, stage 3: Secondary | ICD-10-CM | POA: Diagnosis not present

## 2018-09-25 DIAGNOSIS — L89222 Pressure ulcer of left hip, stage 2: Secondary | ICD-10-CM | POA: Diagnosis present

## 2018-09-25 DIAGNOSIS — E876 Hypokalemia: Secondary | ICD-10-CM | POA: Diagnosis not present

## 2018-09-25 DIAGNOSIS — Y92230 Patient room in hospital as the place of occurrence of the external cause: Secondary | ICD-10-CM | POA: Diagnosis not present

## 2018-09-25 DIAGNOSIS — R740 Nonspecific elevation of levels of transaminase and lactic acid dehydrogenase [LDH]: Secondary | ICD-10-CM | POA: Diagnosis present

## 2018-09-25 DIAGNOSIS — R000 Tachycardia, unspecified: Secondary | ICD-10-CM | POA: Diagnosis not present

## 2018-09-25 DIAGNOSIS — E8840 Mitochondrial metabolism disorder, unspecified: Secondary | ICD-10-CM | POA: Diagnosis present

## 2018-09-25 DIAGNOSIS — T783XXA Angioneurotic edema, initial encounter: Secondary | ICD-10-CM | POA: Diagnosis present

## 2018-09-25 DIAGNOSIS — K5900 Constipation, unspecified: Secondary | ICD-10-CM | POA: Diagnosis present

## 2018-09-25 DIAGNOSIS — Z888 Allergy status to other drugs, medicaments and biological substances status: Secondary | ICD-10-CM | POA: Diagnosis not present

## 2018-09-25 DIAGNOSIS — G40101 Localization-related (focal) (partial) symptomatic epilepsy and epileptic syndromes with simple partial seizures, not intractable, with status epilepticus: Principal | ICD-10-CM | POA: Diagnosis present

## 2018-09-25 DIAGNOSIS — G9341 Metabolic encephalopathy: Secondary | ICD-10-CM | POA: Diagnosis present

## 2018-09-25 DIAGNOSIS — E43 Unspecified severe protein-calorie malnutrition: Secondary | ICD-10-CM | POA: Diagnosis present

## 2018-09-25 DIAGNOSIS — R402343 Coma scale, best motor response, flexion withdrawal, at hospital admission: Secondary | ICD-10-CM | POA: Diagnosis present

## 2018-09-25 DIAGNOSIS — I472 Ventricular tachycardia: Secondary | ICD-10-CM | POA: Diagnosis not present

## 2018-09-25 DIAGNOSIS — L8990 Pressure ulcer of unspecified site, unspecified stage: Secondary | ICD-10-CM | POA: Diagnosis present

## 2018-09-25 DIAGNOSIS — Z993 Dependence on wheelchair: Secondary | ICD-10-CM | POA: Diagnosis not present

## 2018-09-25 DIAGNOSIS — Q8789 Other specified congenital malformation syndromes, not elsewhere classified: Secondary | ICD-10-CM | POA: Diagnosis not present

## 2018-09-25 DIAGNOSIS — Z7401 Bed confinement status: Secondary | ICD-10-CM

## 2018-09-25 DIAGNOSIS — K9423 Gastrostomy malfunction: Secondary | ICD-10-CM | POA: Diagnosis present

## 2018-09-25 DIAGNOSIS — R7401 Elevation of levels of liver transaminase levels: Secondary | ICD-10-CM

## 2018-09-25 DIAGNOSIS — Z5329 Procedure and treatment not carried out because of patient's decision for other reasons: Secondary | ICD-10-CM | POA: Diagnosis not present

## 2018-09-25 DIAGNOSIS — Z95828 Presence of other vascular implants and grafts: Secondary | ICD-10-CM | POA: Diagnosis not present

## 2018-09-25 DIAGNOSIS — R5081 Fever presenting with conditions classified elsewhere: Secondary | ICD-10-CM | POA: Diagnosis not present

## 2018-09-25 HISTORY — DX: Other specified congenital malformation syndromes, not elsewhere classified: Q87.89

## 2018-09-25 HISTORY — DX: Leigh's disease: G31.82

## 2018-09-25 HISTORY — DX: Pressure ulcer of sacral region, stage 2: L89.152

## 2018-09-25 HISTORY — DX: Pressure ulcer of unspecified site, unspecified stage: L89.90

## 2018-09-25 LAB — COMPREHENSIVE METABOLIC PANEL
ALT: 24 U/L (ref 0–44)
AST: 18 U/L (ref 15–41)
Albumin: 2.9 g/dL — ABNORMAL LOW (ref 3.5–5.0)
Alkaline Phosphatase: 90 U/L (ref 38–126)
Anion gap: 12 (ref 5–15)
BILIRUBIN TOTAL: 0.8 mg/dL (ref 0.3–1.2)
BUN: 8 mg/dL (ref 6–20)
CO2: 17 mmol/L — ABNORMAL LOW (ref 22–32)
Calcium: 9 mg/dL (ref 8.9–10.3)
Chloride: 109 mmol/L (ref 98–111)
Creatinine, Ser: 0.61 mg/dL (ref 0.61–1.24)
GFR calc Af Amer: >60 mL/min (ref >60)
Glucose, Bld: 95 mg/dL (ref 70–99)
Potassium: 3.6 mmol/L (ref 3.5–5.1)
Sodium: 138 mmol/L (ref 135–145)
Total Protein: 6.5 g/dL (ref 6.5–8.1)

## 2018-09-25 LAB — PROTIME-INR
INR: 1.16
PROTHROMBIN TIME: 14.7 s (ref 11.4–15.2)

## 2018-09-25 LAB — CBC WITH DIFFERENTIAL/PLATELET
Abs Immature Granulocytes: 0.04 10*3/uL (ref 0.00–0.07)
Basophils Absolute: 0.1 10*3/uL (ref 0.0–0.1)
Basophils Relative: 1 %
Eosinophils Absolute: 0.1 10*3/uL (ref 0.0–0.5)
Eosinophils Relative: 1 %
HCT: 34.3 % — ABNORMAL LOW (ref 39.0–52.0)
Hemoglobin: 11.4 g/dL — ABNORMAL LOW (ref 13.0–17.0)
Immature Granulocytes: 0 %
Lymphocytes Relative: 16 %
Lymphs Abs: 1.6 10*3/uL (ref 0.7–4.0)
MCH: 28.5 pg (ref 26.0–34.0)
MCHC: 33.2 g/dL (ref 30.0–36.0)
MCV: 85.8 fL (ref 80.0–100.0)
Monocytes Absolute: 0.7 10*3/uL (ref 0.1–1.0)
Monocytes Relative: 7 %
Neutro Abs: 7.4 10*3/uL (ref 1.7–7.7)
Neutrophils Relative %: 75 %
Platelets: 345 10*3/uL (ref 150–400)
RBC: 4 MIL/uL — ABNORMAL LOW (ref 4.22–5.81)
RDW: 14.3 % (ref 11.5–15.5)
WBC: 9.9 10*3/uL (ref 4.0–10.5)
nRBC: 0 % (ref 0.0–0.2)

## 2018-09-25 LAB — MAGNESIUM: Magnesium: 1.8 mg/dL (ref 1.7–2.4)

## 2018-09-25 LAB — PHOSPHORUS: Phosphorus: 3.5 mg/dL (ref 2.5–4.6)

## 2018-09-25 LAB — APTT: aPTT: 35 seconds (ref 24–36)

## 2018-09-25 LAB — MRSA PCR SCREENING: MRSA BY PCR: NEGATIVE

## 2018-09-25 MED ORDER — LEVETIRACETAM IN NACL 500 MG/100ML IV SOLN
500.0000 mg | Freq: Two times a day (BID) | INTRAVENOUS | Status: DC
  Administered 2018-09-25: 500 mg via INTRAVENOUS
  Filled 2018-09-25: qty 100

## 2018-09-25 MED ORDER — SODIUM CHLORIDE 0.9 % IV SOLN
200.0000 mg | INTRAVENOUS | Status: AC
  Administered 2018-09-25: 200 mg via INTRAVENOUS
  Filled 2018-09-25: qty 20

## 2018-09-25 MED ORDER — VANCOMYCIN HCL IN DEXTROSE 1-5 GM/200ML-% IV SOLN
1000.0000 mg | Freq: Once | INTRAVENOUS | Status: AC
  Administered 2018-09-25: 1000 mg via INTRAVENOUS
  Filled 2018-09-25: qty 200

## 2018-09-25 MED ORDER — FENTANYL CITRATE (PF) 100 MCG/2ML IJ SOLN
25.0000 ug | INTRAMUSCULAR | Status: AC | PRN
  Administered 2018-09-25 – 2018-09-26 (×3): 25 ug via INTRAVENOUS
  Filled 2018-09-25 (×3): qty 2

## 2018-09-25 MED ORDER — SODIUM CHLORIDE 0.9 % IV SOLN: 200.0000 mg | Freq: Once | INTRAVENOUS | Status: DC

## 2018-09-25 MED ORDER — LEVETIRACETAM IN NACL 1500 MG/100ML IV SOLN
1500.0000 mg | Freq: Two times a day (BID) | INTRAVENOUS | Status: DC
  Administered 2018-09-26 – 2018-10-08 (×25): 1500 mg via INTRAVENOUS
  Filled 2018-09-25 (×27): qty 100

## 2018-09-25 MED ORDER — SODIUM CHLORIDE 0.9 % IV SOLN: 200.0000 mg | Freq: Two times a day (BID) | INTRAVENOUS | Status: DC

## 2018-09-25 MED ORDER — LORAZEPAM 2 MG/ML IJ SOLN
0.5000 mg | INTRAMUSCULAR | Status: DC | PRN
  Administered 2018-09-25 – 2018-10-04 (×10): 0.5 mg via INTRAVENOUS
  Filled 2018-09-25 (×10): qty 1

## 2018-09-25 MED ORDER — SODIUM CHLORIDE 0.9 % IV SOLN
100.0000 mg | Freq: Two times a day (BID) | INTRAVENOUS | Status: DC
  Filled 2018-09-25: qty 10

## 2018-09-25 MED ORDER — POTASSIUM CHLORIDE IN NACL 20-0.9 MEQ/L-% IV SOLN
INTRAVENOUS | Status: DC
  Administered 2018-09-25 – 2018-09-27 (×3): via INTRAVENOUS
  Filled 2018-09-25 (×3): qty 1000

## 2018-09-25 MED ORDER — SODIUM CHLORIDE 0.9 % IV BOLUS
1000.0000 mL | Freq: Once | INTRAVENOUS | Status: AC
  Administered 2018-09-25: 1000 mL via INTRAVENOUS

## 2018-09-25 MED ORDER — SODIUM CHLORIDE 0.9 % IV SOLN
2.0000 g | Freq: Three times a day (TID) | INTRAVENOUS | Status: DC
  Administered 2018-09-25 – 2018-09-29 (×12): 2 g via INTRAVENOUS
  Filled 2018-09-25 (×13): qty 2

## 2018-09-25 MED ORDER — SODIUM CHLORIDE 0.9 % IV SOLN: 100.0000 mg | INTRAVENOUS | Status: DC

## 2018-09-25 MED ORDER — DEXTROSE 5 % IV SOLN
450.0000 mg | Freq: Three times a day (TID) | INTRAVENOUS | Status: DC
  Administered 2018-09-25 – 2018-09-29 (×11): 450 mg via INTRAVENOUS
  Filled 2018-09-25 (×14): qty 9

## 2018-09-25 MED ORDER — DEXTROSE-NACL 5-0.9 % IV SOLN
INTRAVENOUS | Status: DC
  Administered 2018-09-26 – 2018-09-29 (×4): via INTRAVENOUS

## 2018-09-25 MED ORDER — SODIUM CHLORIDE 0.9 % IV SOLN
200.0000 mg | Freq: Two times a day (BID) | INTRAVENOUS | Status: DC
  Administered 2018-09-26 – 2018-10-08 (×25): 200 mg via INTRAVENOUS
  Filled 2018-09-25 (×29): qty 20

## 2018-09-25 NOTE — Plan of Care (Signed)
Patient turned q2

## 2018-09-25 NOTE — H&P (Signed)
History and Physical    Jason Glover. ZOX:[id number] DOB: [birth year] DOA: 09/25/2018  PCP: [name], [name], MD   Patient coming from: Home.  I have personally briefly reviewed patient's old medical records in [facility]  Chief Complaint: Fever and seizures.  HPI: Jason Heaterhaddeus R Westley Glover. is a 27 y.o. male with medical history significant of mitochondrial Leigh syndrome, pressure ulcers, feeding tube placement who was transferred from [facility] after presenting there with fever and uncontrollable movements of his RUE.  He is normally able to communicate and uses a motorized wheelchair.  He participates in group activities and he is 1 of the top performance.  However, since Tuesday he has been nonverbal.  He has been having twitching movements of his RUE and face.  His mother states that he had a normal EEG last month at another facility.   ED Course: Initial vital signs the transferring facility were temperature 100.2 F, heart rate 126, respiratory rate 16, blood pressure 127/105 mmHg and O2 sat 100% on unspecified oxygen requirement.  He was given acyclovir, Keppra, aspirin and vancomycin.  He was consulted by tele-neurology who suggested inpatient neurology evaluation.  Transport course: Per patient's mother, his PEG tube fell off in the ambulance, while he was being transferred to this facility.  She states that she immediately placed the back.  Review of Systems: Unable to obtain..   Past Medical History:  Diagnosis Date  . Leigh syndrome (HCC)   . Mitochondrial Leigh syndrome   . Pressure ulcer     Past Surgical History:  Procedure Laterality Date  . GASTROSTOMY TUBE PLACEMENT    . 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     reports that he has never smoked. He does not have any smokeless tobacco history on file. He reports that he does not drink alcohol or use drugs.  No Known Allergies  Family History  Problem Relation Age of Onset  .  COPD Father   . Pancreatic cancer Maternal Grandmother    Prior to Admission medications   Medication Sig Start Date End Date Taking? Authorizing Provider  B Complex-Biotin-FA (B COMPLETE) TABS Take 1 tablet by mouth daily.    [provider]  ferrous sulfate 325 (65 FE) MG EC tablet Take 325 mg by mouth daily with breakfast.    [provider]  LevOCARNitine (L-CARNITINE) 250 MG CAPS Take 250 mg by mouth daily.    [provider]  polyethylene glycol (MIRALAX / GLYCOLAX) packet Take 17 g by mouth every other day.    [provider]    Physical Exam: Vitals:   09/25/18 1245 09/25/18 1639  BP: 116/79 119/89  Pulse: (!) 113 (!) 112  Resp: 16 17  Temp: 99.2 F (37.3 C) 99.1 F (37.3 C)  TempSrc: Axillary Axillary  SpO2: 100% 100%  Weight: 47.8 kg     Constitutional: NAD, calm, comfortable Eyes: PERRL, lids and conjunctivae normal ENMT: Mucous membranes are dry.  Posterior pharynx clear of any exudate or lesions.  Neck: normal, supple, no masses, no thyromegaly Respiratory: Decreased breath sounds in bases, otherwise clear to auscultation bilaterally, no wheezing, no crackles. Normal respiratory effort. No accessory muscle use.  Cardiovascular: Regular rate and rhythm, no murmurs / rubs / gallops. No extremity edema. 2+ pedal pulses. No carotid bruits.  Abdomen: Feeding tube in place, soft, no tenderness, no masses palpated. No hepatosplenomegaly. Bowel sounds positive.  Musculoskeletal: no clubbing / cyanosis.  Good ROM. Normal  muscle tone.  Skin: no rashes, lesions, ulcers. No induration Neurologic: CN 2-12 grossly intact.  Unable to evaluate sensation.  DTRs are brisk.  Unspecified global weakness.  Patient's extremities are in flexed position. Psychiatric: Nonverbal.  Labs on Admission: I have personally reviewed following labs and imaging studies  CBC: Recent Labs  Lab 09/25/18 1412  WBC 9.9  NEUTROABS 7.4  HGB 11.4*  HCT 34.3*  MCV  85.8  PLT 345   Basic Metabolic Panel: Recent Labs  Lab 09/25/18 1412  NA 138  K 3.6  CL 109  CO2 17*  GLUCOSE 95  BUN 8  CREATININE 0.61  CALCIUM 9.0  MG 1.8  PHOS 3.5   GFR: CrCl cannot be calculated (Unknown ideal weight.). Liver Function Tests: Recent Labs  Lab 09/25/18 1412  AST 18  ALT 24  ALKPHOS 90  BILITOT 0.8  PROT 6.5  ALBUMIN 2.9*   No results for input(s): LIPASE, AMYLASE in the last 168 hours. No results for input(s): AMMONIA in the last 168 hours. Coagulation Profile: Recent Labs  Lab 09/25/18 1412  INR 1.16   Cardiac Enzymes: No results for input(s): CKTOTAL, CKMB, CKMBINDEX, TROPONINI in the last 168 hours. BNP (last 3 results) No results for input(s): PROBNP in the last 8760 hours. HbA1C: No results for input(s): HGBA1C in the last 72 hours. CBG: No results for input(s): GLUCAP in the last 168 hours. Lipid Profile: No results for input(s): CHOL, HDL, LDLCALC, TRIG, CHOLHDL, LDLDIRECT in the last 72 hours. Thyroid Function Tests: No results for input(s): TSH, T4TOTAL, FREET4, T3FREE, THYROIDAB in the last 72 hours. Anemia Panel: No results for input(s): VITAMINB12, FOLATE, FERRITIN, TIBC, IRON, RETICCTPCT in the last 72 hours. Urine analysis: No results found for: COLORURINE, APPEARANCEUR, LABSPEC, PHURINE, GLUCOSEU, HGBUR, BILIRUBINUR, KETONESUR, PROTEINUR, UROBILINOGEN, NITRITE, LEUKOCYTESUR  Radiological Exams on Admission: No results found.  EKG: Independently reviewed.   Assessment/Plan Principal Problem:   Seizures (HCC) Admit to stepdown/inpatient. Continue Keppra 500 mg IVPB every 12 hours. Continue Vimpat per neurology. Check EEG. Seizure precautions. Lorazepam 0.5 mg IVP every 4 hours as needed for seizures.  Active Problems:   Pressure injury of skin Continue local care.    Fever Continue IV fluids. Continue acyclovir, aztreonam and vancomycin per pharmacy.    Mitochondrial Leigh syndrome Resume levocarnitine  once PEG tube malfunction resolved.    PEG tube malfunction (HCC) IR will evaluate in the morning.    Constipation Continue MiraLAX once cleared for oral or tube intake.   DVT prophylaxis: Heparin SQ. Code Status: Full code. Family Communication: His mother was by bedside. Disposition Plan: Admit for further work-up and treatment. Consults called: Neuro hospitalist and IR. Admission status: Inpatient/stepdown.   Bobette Moavid Manuel Estefani Bateson MD [facility]  If 7PM-7AM, please contact night-coverage [email or web address] Password [email or web address]  09/25/2018, 5:05 PM

## 2018-09-25 NOTE — Progress Notes (Signed)
LTM EEG initiated. Nursing staff educated regarding pt event button.

## 2018-09-25 NOTE — Progress Notes (Signed)
Pharmacy Antibiotic Note  Garry Heaterhaddeus R Seeberger Jr. is a 27 y.o. male admitted on 09/25/2018 with meningitis.  Pharmacy has been consulted for Vancomycin / Aztreonam / Acyclovir  dosing.  Plan: Aztreonam 2 grams iv Q 8 hours Vancomycin 1 gram iv x 1 dose now then 750 mg iv Q 12 Acyclovir 450 mg iv Q 8 hours Follow up Scr and progress  Weight: 105 lb 6.1 oz (47.8 kg)  Temp (24hrs), Avg:99.2 F (37.3 C), Min:99.2 F (37.3 C), Max:99.2 F (37.3 C)  Recent Labs  Lab 09/25/18 1412  WBC 9.9  CREATININE 0.61    CrCl cannot be calculated (Unknown ideal weight.).    No Known Allergies   Thank you for allowing pharmacy to be a part of this patient's care. Okey RegalLisa Haytham Maher, PharmD [phone] 09/25/2018 4:00 PM

## 2018-09-25 NOTE — Plan of Care (Signed)
turned

## 2018-09-25 NOTE — Progress Notes (Signed)
Some orders came in for patient but nothing in terms of medication. Mom came from parking the car and notiicied tears. States patient "had a previous pressure ulcer on his left hip and was not given anything more than tylenol at other facility", also states "it has been a long time since he was sick and does not knows what works".   Nurse paged Dr. Robb Matarrtiz who immediately called back from the Emergency Department. Patient will have an order placed for fentanyl q1 prn/ or as tolerated.

## 2018-09-25 NOTE — Consult Note (Addendum)
Neurology Consultation  Reason for Consult: Partial status Referring Physician: Dr. Robb Matarrtiz  CC: Right-sided twitching which is likely partial status  History is obtained from: Mother  HPI: Jason Heaterhaddeus R Gerhart Jr. is a 27 y.o. male with past medical history of pressure ulcer, mitochondrial leigh syndrome.  Initially patient was admitted for pressure ulcer.  Per mother he is bedridden however he functions at a elevated level.  He is able to talk with full sentences and tell you how to get home if he can find a landmark.  He apparently is 1 of the top people in his class for his function.  He is able to use a wheelchair that is motorized and uses mouth control.  Apparently since Tuesday which is 2 days ago, he has been nonverbal and having intermittent periods of right arm right face and right neck twitching.  Patient was brought to the hospital.  Per nurse the only thing she is heard from him is 1.  In which he says he does watch TV.  Upon consultation patient was noticed to have intermittent periods of left facial, neck, arm twitching that last for approximately 1 to 2 minutes then subsides and then starts back up approximately 1 minute later.  Patient does not come back to baseline in between.  There is question if he has been in partial status since Tuesday.  At that point immediately EEG was called and patient was loaded with 200 mg of Vimpat.  Per mother his last seizure, which was a staring spell, was when he was 27 years of age.  ROS:  Unable to obtain due to altered mental status.   Past Medical History:  Diagnosis Date  . Leigh syndrome (HCC)   . Mitochondrial Leigh syndrome   . Pressure ulcer     Family History  Problem Relation Age of Onset  . COPD Father   . Pancreatic cancer Maternal Grandmother      Social History:   reports that he has never smoked. He does not have any smokeless tobacco history on file. He reports that he does not drink alcohol or use  drugs.  Medications  Current Facility-Administered Medications:  .  0.9 % NaCl with KCl 20 mEq/ L  infusion, , Intravenous, Continuous, Bobette Mortiz, David Manuel, MD .  acyclovir (ZOVIRAX) 450 mg in dextrose 5 % 100 mL IVPB, 450 mg, Intravenous, Q8H, Bobette Mortiz, David Manuel, MD .  aztreonam (AZACTAM) 2 g in sodium chloride 0.9 % 100 mL IVPB, 2 g, Intravenous, Q8H, Bobette Mortiz, David Manuel, MD .  fentaNYL (SUBLIMAZE) injection 25 mcg, 25 mcg, Intravenous, Q1H PRN, Bobette Mortiz, David Manuel, MD, 25 mcg at 09/25/18 1427 .  [START ON 09/26/2018] lacosamide (VIMPAT) 100 mg in sodium chloride 0.9 % 25 mL IVPB, 100 mg, Intravenous, Q12H, Ulice DashSmith, David R, PA-C .  lacosamide (VIMPAT) 200 mg in sodium chloride 0.9 % 25 mL IVPB, 200 mg, Intravenous, STAT, Ulice DashSmith, David R, PA-C .  levETIRAcetam (KEPPRA) IVPB 500 mg/100 mL premix, 500 mg, Intravenous, Q12H, Bobette Mortiz, David Manuel, MD .  LORazepam (ATIVAN) injection 0.5 mg, 0.5 mg, Intravenous, Q4H PRN, Bobette Mortiz, David Manuel, MD .  sodium chloride 0.9 % bolus 1,000 mL, 1,000 mL, Intravenous, Once, Bobette Mortiz, David Manuel, MD .  vancomycin [facility](VANCOCIN) IVPB 1000 mg/200 mL premix, 1,000 mg, Intravenous, Once, Bobette Mortiz, David Manuel, MD   Exam: Current vital signs: BP 119/89 (BP Location: Left Arm)   Pulse (!) 112   Temp 99.1 F (37.3 C) (Axillary)   Resp 17  Wt 47.8 kg   SpO2 100%  Vital signs in last 24 hours: Temp:  [99.1 F (37.3 C)-99.2 F (37.3 C)] 99.1 F (37.3 C) (12/19 1639) Pulse Rate:  [112-113] 112 (12/19 1639) Resp:  [16-17] 17 (12/19 1639) BP: (116-119)/(79-89) 119/89 (12/19 1639) SpO2:  [100 %] 100 % (12/19 1639) Weight:  [47.8 kg] 47.8 kg (12/19 1245)  Physical Exam  Constitutional: Appears thin,  Psych: unresponsive  Eyes: No scleral injection HENT: No OP obstrucion Head: Normocephalic.  Cardiovascular: Normal rate and regular rhythm.  Respiratory: Effort normal, non-labored breathing GI: Soft.  No distension. There is no tenderness.  Skin:  WDI  Neuro: Mental Status: Has disconjugate gaze, positive doll's eyes, does not react to noxious stimuli nor follow verbal commands.  He is drooling from his mouth. Cranial Nerves: II: No blink to threat III,IV, VI: Doll's eyes intact. Pupils are equal, round, and reactive to light.   V: Face is not symmetrical however patient's head is turned to the right and twitching. VII: Facial movement is symmetric.  VIII: No response to voice   Motor: Increased tone of bilateral lower extremities with contracture of bilateral legs at the knee.  Appears flaccid at the right arm and less he is having a partial seizure in which his right bicep, tricep is twitching.  Left arm has increased tone and held at a 90 degree angle Sensory: No response to noxious stimuli Deep Tendon Reflexes: 2+ at the bicep no reflex at the knee or the Achilles Plantars: Mute bilaterally  Labs I have reviewed labs in epic and the results pertinent to this consultation are:   CBC    Component Value Date/Time   WBC 9.9 09/25/2018 1412   RBC 4.00 (L) 09/25/2018 1412   HGB 11.4 (L) 09/25/2018 1412   HCT 34.3 (L) 09/25/2018 1412   PLT 345 09/25/2018 1412   MCV 85.8 09/25/2018 1412   MCH 28.5 09/25/2018 1412   MCHC 33.2 09/25/2018 1412   RDW 14.3 09/25/2018 1412   LYMPHSABS 1.6 09/25/2018 1412   MONOABS 0.7 09/25/2018 1412   EOSABS 0.1 09/25/2018 1412   BASOSABS 0.1 09/25/2018 1412    CMP     Component Value Date/Time   NA 138 09/25/2018 1412   K 3.6 09/25/2018 1412   CL 109 09/25/2018 1412   CO2 17 (L) 09/25/2018 1412   GLUCOSE 95 09/25/2018 1412   BUN 8 09/25/2018 1412   CREATININE 0.61 09/25/2018 1412   CALCIUM 9.0 09/25/2018 1412   PROT 6.5 09/25/2018 1412   ALBUMIN 2.9 (L) 09/25/2018 1412   AST 18 09/25/2018 1412   ALT 24 09/25/2018 1412   ALKPHOS 90 09/25/2018 1412   BILITOT 0.8 09/25/2018 1412   GFRNONAA >60 09/25/2018 1412   GFRAA >60 09/25/2018 1412    Lipid Panel  No results found  for: CHOL, TRIG, HDL, CHOLHDL, VLDL, LDLCALC, LDLDIRECT   EEG-pending  Felicie Morn PA-C [facility] [phone]  M-F  (9:00 am- 5:00 PM)  09/25/2018, 4:54 PM    ASSESSMENT AND PLAN  53 y male with Leigh's syndrome transferred from  [facility] with altered mental status, right upper extremity jerking since Tuesday in the setting of fever with unknown source of infection.  Patient was started on Keppra 5 mg twice daily and transferred.  On assessment he remains nonverbal with rhythmic twitching of his right upper extremity.  Stat EEG does not show epileptiform discharges.  Focal status epilepticus   Recommendations: - Loading dose of  Vimpat 200 mg once - Every 12 hours Vimpat 200 mg IV --Increase Keppra 2005 mg twice daily -Seizure precautions -EEG-LTM --Continue to evaluate for source for infection --Avoid using valproic acid to treat seizures given mitochondrial disease -- Check Ammonia -- MRI brain w.wo contrast     This patient is neurologically critically ill due to focal status epilepticus He is at risk for significant risk of neurological worsening from worsening seizures. cerebral edema,, heart failure,  infection, respiratory failure and seizure. This patient's care requires constant monitoring of vital signs, hemodynamics, respiratory and cardiac monitoring, review of multiple databases, reviewing LTM EEG, neurological assessment, discussion with family, other specialists and medical decision making of high complexity.  I spent 50  minutes of neurocritical time in the care of this patient.

## 2018-09-26 ENCOUNTER — Inpatient Hospital Stay (HOSPITAL_COMMUNITY): Payer: Medicaid Other

## 2018-09-26 LAB — URINALYSIS, ROUTINE W REFLEX MICROSCOPIC
Bacteria, UA: NONE SEEN
Bilirubin Urine: NEGATIVE
Glucose, UA: NEGATIVE mg/dL
Ketones, ur: NEGATIVE mg/dL
LEUKOCYTES UA: NEGATIVE
Nitrite: NEGATIVE
Protein, ur: NEGATIVE mg/dL
Specific Gravity, Urine: 1.008 (ref 1.005–1.030)
pH: 5 (ref 5.0–8.0)

## 2018-09-26 LAB — CBC WITH DIFFERENTIAL/PLATELET
Abs Immature Granulocytes: 0.04 10*3/uL (ref 0.00–0.07)
Basophils Absolute: 0.1 10*3/uL (ref 0.0–0.1)
Basophils Relative: 1 %
Eosinophils Absolute: 0.1 10*3/uL (ref 0.0–0.5)
Eosinophils Relative: 2 %
HCT: 34.7 % — ABNORMAL LOW (ref 39.0–52.0)
HEMOGLOBIN: 11.2 g/dL — AB (ref 13.0–17.0)
Immature Granulocytes: 1 %
Lymphocytes Relative: 21 %
Lymphs Abs: 1.9 10*3/uL (ref 0.7–4.0)
MCH: 28.5 pg (ref 26.0–34.0)
MCHC: 32.3 g/dL (ref 30.0–36.0)
MCV: 88.3 fL (ref 80.0–100.0)
MONO ABS: 0.9 10*3/uL (ref 0.1–1.0)
MONOS PCT: 10 %
Neutro Abs: 5.8 10*3/uL (ref 1.7–7.7)
Neutrophils Relative %: 65 %
Platelets: 324 10*3/uL (ref 150–400)
RBC: 3.93 MIL/uL — ABNORMAL LOW (ref 4.22–5.81)
RDW: 14.3 % (ref 11.5–15.5)
WBC: 8.8 10*3/uL (ref 4.0–10.5)
nRBC: 0 % (ref 0.0–0.2)

## 2018-09-26 LAB — COMPREHENSIVE METABOLIC PANEL
ALT: 22 U/L (ref 0–44)
ANION GAP: 12 (ref 5–15)
AST: 19 U/L (ref 15–41)
Albumin: 2.6 g/dL — ABNORMAL LOW (ref 3.5–5.0)
Alkaline Phosphatase: 75 U/L (ref 38–126)
BUN: 6 mg/dL (ref 6–20)
CO2: 16 mmol/L — ABNORMAL LOW (ref 22–32)
Calcium: 9 mg/dL (ref 8.9–10.3)
Chloride: 111 mmol/L (ref 98–111)
Creatinine, Ser: 0.78 mg/dL (ref 0.61–1.24)
GFR calc Af Amer: >60 mL/min (ref >60)
GFR calc non Af Amer: >60 mL/min (ref >60)
GLUCOSE: 98 mg/dL (ref 70–99)
Potassium: 3.6 mmol/L (ref 3.5–5.1)
Sodium: 139 mmol/L (ref 135–145)
Total Bilirubin: 0.6 mg/dL (ref 0.3–1.2)
Total Protein: 6.1 g/dL — ABNORMAL LOW (ref 6.5–8.1)

## 2018-09-26 MED ORDER — GADOBUTROL 1 MMOL/ML IV SOLN
4.0000 mL | Freq: Once | INTRAVENOUS | Status: AC | PRN
  Administered 2018-09-26: 4 mL via INTRAVENOUS

## 2018-09-26 MED ORDER — SODIUM CHLORIDE 0.9 % IV SOLN
1000.0000 mg | Freq: Once | INTRAVENOUS | Status: AC
  Administered 2018-09-26: 1000 mg via INTRAVENOUS
  Filled 2018-09-26: qty 20

## 2018-09-26 MED ORDER — PHENYTOIN SODIUM 50 MG/ML IJ SOLN
100.0000 mg | Freq: Three times a day (TID) | INTRAMUSCULAR | Status: DC
  Administered 2018-09-26 – 2018-10-04 (×23): 100 mg via INTRAVENOUS
  Filled 2018-09-26 (×26): qty 2

## 2018-09-26 MED ORDER — VANCOMYCIN HCL 500 MG IV SOLR
500.0000 mg | Freq: Three times a day (TID) | INTRAVENOUS | Status: DC
  Administered 2018-09-26 – 2018-09-28 (×7): 500 mg via INTRAVENOUS
  Filled 2018-09-26 (×8): qty 500

## 2018-09-26 NOTE — Progress Notes (Signed)
MEDICATION RELATED CONSULT NOTE - INITIAL   Pharmacy Consult for Phenytoin Indication: seizures  Allergies  Allergen Reactions  . Rocephin [Ceftriaxone] Hives    Patient Measurements: Weight: 105 lb 6.1 oz (47.8 kg)  Vital Signs: Temp: 97.6 F (36.4 C) (12/20 1554) Temp Source: Axillary (12/20 1554) BP: 114/94 (12/20 1554) Pulse Rate: 95 (12/20 1554) Intake/Output from previous day: 12/19 0701 - 12/20 0700 In: 1100.5 [I.V.:898.6; IV Piggyback:201.9] Out: 2000 [Urine:2000] Intake/Output from this shift: No intake/output data recorded.  Labs: Recent Labs    09/25/18 1412 09/26/18 0455  WBC 9.9 8.8  HGB 11.4* 11.2*  HCT 34.3* 34.7*  PLT 345 324  APTT 35  --   CREATININE 0.61 0.78  MG 1.8  --   PHOS 3.5  --   ALBUMIN 2.9* 2.6*  PROT 6.5 6.1*  AST 18 19  ALT 24 22  ALKPHOS 90 75  BILITOT 0.8 0.6   CrCl cannot be calculated (Unknown ideal weight.).   Microbiology: Recent Results (from the past 720 hour(s))  MRSA PCR Screening     Status: None   Collection Time: 09/25/18 12:50 PM  Result Value Ref Range Status   MRSA by PCR NEGATIVE NEGATIVE Final    Comment:        The GeneXpert MRSA Assay (FDA approved for NASAL specimens only), is one component of a comprehensive MRSA colonization surveillance program. It is not intended to diagnose MRSA infection nor to guide or monitor treatment for MRSA infections. Performed at [facility], [address] [place], [place] [address]     Medical History: Past Medical History:  Diagnosis Date  . Leigh syndrome (HCC)   . Mitochondrial Leigh syndrome   . Pressure ulcer     Medications:  Medications Prior to Admission  Medication Sig Dispense Refill Last Dose  . B Complex-Biotin-FA (B COMPLETE) TABS Take 1 tablet by mouth daily.   09/23/2018  . ferrous sulfate 325 (65 FE) MG EC tablet Take 325 mg by mouth daily with breakfast.   09/23/2018  . LevOCARNitine (L-CARNITINE) 250 MG CAPS Take 250 mg by  mouth daily.   09/23/2018  . polyethylene glycol (MIRALAX / GLYCOLAX) packet Take 17 g by mouth every other day.   09/23/2018    Assessment: 27 year old male with history of cerebral palsy, mitochondrial Leigh syndrome, pressure ulcers, feeding tube, was transferred from [facility] after presenting there with fevers and uncontrollable right upper extremity movements concerning for seizures. Pt was on continuous EEG monitoring which did not show seizures but pt continues with arm twiching.  Pharmacy consulted for phenytoin maintenance dosing. Pt received 1gm load ~1700 today. Pt also on Keppra and Vimpat.  SCr wnl/stable, alb 2.6 (low) at baseline  Goal of Therapy:  Phenytoin level 10-20 mcg/ml  Plan:  Phenytoin 100mg  IV q8h Will f/u phenytoin level in 5-7 days (at [facility])  Christoper Fabianaron Marceline Napierala, PharmD, BCPS Clinical pharmacist  **Pharmacist phone directory can now be found on [email or web address] ([email or web address]).  Listed under [facility]. 09/26/2018,6:34 PM

## 2018-09-26 NOTE — Progress Notes (Addendum)
Physician Lafe GarinGherge returned Nurse call and stated that if the other 2 attempts were unsuccessful then there is no need for a foot draw as patient is currently on antibiotics: see MAR. New orders were placed this morning for a urinalysis Physician wants that collected.

## 2018-09-26 NOTE — Progress Notes (Signed)
Pharmacy Antibiotic Note  Jason Glover. is a 27 y.o. male admitted on 09/25/2018 with meningitis.  Pharmacy has been consulted for Vancomycin / Aztreonam / Acyclovir  dosing.  Plan: Aztreonam 2g IV Q8h Change vancomycin to 500mg  IV Q8h Acyclovir 450 mg IV Q8h Follow up Scr and progress  Weight: 105 lb 6.1 oz (47.8 kg)  Temp (24hrs), Avg:98.3 F (36.8 C), Min:97.5 F (36.4 C), Max:99.2 F (37.3 C)  Recent Labs  Lab 09/25/18 1412 09/26/18 0455  WBC 9.9 8.8  CREATININE 0.61 0.78    CrCl cannot be calculated (Unknown ideal weight.).    Allergies  Allergen Reactions  . Rocephin [Ceftriaxone] Hives   [name], PharmD, BCPS, [facility] Clinical Pharmacist Phone number [phone] 09/26/2018 10:19 AM

## 2018-09-26 NOTE — Progress Notes (Signed)
Nurse spoke with lab this morning, patient second attempt for blood cultures was unsuccessful. Lab needs an order for foot stick, with the knowledge that a failed third attempt will result in a 24 hold from drawing labs on the patient. The other option is having a central line placed. Nurse contacted Physician Gherge who is attending and left a message.

## 2018-09-26 NOTE — Progress Notes (Signed)
Patient on continuous EEG overnight, leave camera on patient at all times, if seizure is noticed by staff then staff is to push the panic button. Passed on in report.

## 2018-09-26 NOTE — Progress Notes (Signed)
Removed EEG electrodes; no skin breakdown was seen. Attempted to wash patient's head but mom insisted on cleaning him instead.

## 2018-09-26 NOTE — Progress Notes (Signed)
Nurse in Progression with social worker who made a mention/questioned patient having meningitis based on a note left by Pharmacist today. Nurse called Pharmacist Enzo BiNathan Batchelder for clarification.    Notes by Dr. Elvera LennoxGherghe, Dr. Robb Matarrtiz or ZDr. Aroor have nothing mentioned about meningitis.   Enzo BiNathan Batchelder stated that he will call unit's Pharmacist for clarification.

## 2018-09-26 NOTE — Procedures (Signed)
Electroencephalogram report.  Long-term monitoring  Date acquisition: International 10-20 for electrode placement.  18 channels EEG with additional eyes or interrupts lateral ears and EKG.  Recording begins 09/25/2018 at 1723 Recording ends 09/26/2018 at 08 30  Day 1 CPT 95951  This intensive EEG monitoring with simultaneous video monitoring was performed for this patient with spells accompanied by altered mental status and twitching to rule out clinical and subclinical seizures.  Medications as per EMR  Background activities marked by continuous and reactive background activities with stage changes.  Background activities predominantly tend to range in the delta range between 2 to 4 cps with anterior dominance.  Intermittently background activities tend to reach 6 cps with posterior dominance.  At times delta slowing occur in periodic rhythmic runs with anterior dominance consistent with frontally dominant intermittent rhythmic delta activities or  FIRDA.  There was no interhemispheric asymmetries, focal abnormalities or epileptiform discharges present.  No clinical subclinical seizures  Several events of interest were present and identified by staff is a stereotypical events marked by a right arm twitching around 2300.  During this times there was no any EEG changes to suggest seizures.  Clinical interpretation: This 15 hours of intensive EEG monitoring with simultaneous video monitoring did not record any clinical subclinical seizures.  Background activities marked by background activity slowing and presence of frontally dominant intermittent rhythmic delta activities.  These findings suggestive of mild encephalopathy of nonspecific etiologies.  Metabolic causes would be favored.  Spells accompanied by a right arm twitching were not seizures.  Clinical correlation is advised.

## 2018-09-26 NOTE — Progress Notes (Signed)
Attempted to disconnect EEG leads but Mom suggested I wait until she finished cleaning patient up. Will try again when schedule permits.

## 2018-09-26 NOTE — Progress Notes (Signed)
EEG is complete. Patient is more verbally responsive in front of mom and Nurse also gave Nurse a thumbs up with his left thumb. Shaking with RUE is still intermittent.

## 2018-09-26 NOTE — Progress Notes (Signed)
Reason for consult: Seizure/focal status epilepticus   Subjective: patient has less frequent rhythmic jerking over right arm. Still non verbal.    ROS:  Unable to obtain due to poor mental status  Examination  Vital signs in last 24 hours: Temp:  [97.5 F (36.4 C)-98.1 F (36.7 C)] 97.8 F (36.6 C) (12/20 2011) Pulse Rate:  [94-110] 103 (12/20 2011) Resp:  [15-25] 17 (12/20 2011) BP: (112-132)/(79-94) 112/81 (12/20 2011) SpO2:  [100 %] 100 % (12/20 2011)  General: lying in bed CVS: pulse-normal rate and rhythm RS: breathing comfortably Extremities: normal   Neuro: Patient is awake but not following any commands.  Right arm appears to have slightly improved tone compared to yesterday.  Basic Metabolic Panel: Recent Labs  Lab 09/25/18 1412 09/26/18 0455  NA 138 139  K 3.6 3.6  CL 109 111  CO2 17* 16*  GLUCOSE 95 98  BUN 8 6  CREATININE 0.61 0.78  CALCIUM 9.0 9.0  MG 1.8  --   PHOS 3.5  --     CBC: Recent Labs  Lab 09/25/18 1412 09/26/18 0455  WBC 9.9 8.8  NEUTROABS 7.4 5.8  HGB 11.4* 11.2*  HCT 34.3* 34.7*  MCV 85.8 88.3  PLT 345 324     Coagulation Studies: Recent Labs    09/25/18 1412  LABPROT 14.7  INR 1.16    Imaging Reviewed:    This 15 hours of intensive EEG monitoring with simultaneous video monitoring did not record any clinical subclinical seizures.  Background activities marked by background activity slowing and presence of frontally dominant intermittent rhythmic delta activities.  These findings suggestive of mild encephalopathy of nonspecific etiologies.  Metabolic causes would be favored.  Spells accompanied by a right arm twitching were not seizures.  Clinical correlation is advised.   ASSESSMENT AND PLAN  [age] y male with Leigh's syndrome [facility] with altered mental status, right upper extremity jerking since Tuesday in the setting of fever with unknown source of infection.    Focal  status epilepticus   Recommendations: - Continue Vimpat 200 mg BID --Continue 1500 mg twice daily -- Started Dilantin  -Seizure precautions -EEG-LTM showed no electrographic seizures, but can miss simple partial status  --Continue to evaluate for source for infection --Avoid using valproic acid to treat seizures given mitochondrial disease -- MRI brain w.wo contrast     Jason Glover [facility] Pager Number [phone] For questions after 7pm please refer to AMION to reach the Neurologist on call

## 2018-09-26 NOTE — Progress Notes (Addendum)
PROGRESS NOTE  Jason Heaterhaddeus R Jaquith Jr. Jason Glover DOB: [birth year] DOA: 09/25/2018 PCP: Jason BlamerHarris, [name], MD   LOS: 1 day   Brief Narrative / Interim history: 27 year old male with history of cerebral palsy, mitochondrial Leigh syndrome, pressure ulcers, feeding tube, was [facility] after presenting there with fevers and uncontrollable right upper extremity movements concerning for seizures.  At baseline he is able to communicate and uses a motorized wheelchair.  He participates in group activities and apparently he performs really well.  Since last Tuesday he has been nonverbal and has been having twitching movements of the right upper extremity and face.  Subjective: -Opens eyes when called but he is nonverbal.  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  Continuous EEG monitoring underway -MRI brain pending, recommended by neurology  Additional Problems Fever -Patient was started empirically on acyclovir, aztreonam and vancomycin.  Appreciate neurology recommendations, he is afebrile here.  Could not obtain blood cultures due to poor stick however has been on antibiotics already and less likely that it would be useful.  Chest x-ray without active disease.  Urinalysis pending -If cultures remain negative and he is afebrile may be able to discontinue antibiotics within the next day  Mitochondrial Leigh syndrome -Resume levocarnitine once PEG tube malfunction resolved  PEG tube malfunction -IR to evaluate  Scheduled Meds: Continuous Infusions: . 0.9 % NaCl with KCl 20 mEq / L 75 mL/hr at 09/26/18 0914  .  acyclovir 450 mg (09/26/18 0949)  . aztreonam 2 g (09/26/18 0916)  . dextrose 5 % and 0.9% NaCl 50 mL/hr at 09/26/18 0048  . lacosamide (VIMPAT) IV 200 mg (09/26/18 0551)  . levETIRAcetam 1,500 mg (09/26/18 0500)  . vancomycin     PRN Meds:.fentaNYL (SUBLIMAZE) injection, LORazepam  DVT prophylaxis: heparin Code Status: Full code Family Communication: No family present at bedside Disposition Plan: To be determined  Consultants:   Neurology  Procedures:   Continuous EEG  Antimicrobials:  Vancomycin, aztreonam, Acyclovir  Objective: Vitals:   09/26/18 0200 09/26/18 0400 09/26/18 0600 09/26/18 0729  BP: 127/87 118/82 125/86 118/82  Pulse: (!) 105 94 (!) 103 94  Resp: 18 15 (!) 24 15  Temp:  98.1 F (36.7 C)  (!) 97.5 F (36.4 C)  TempSrc:  Oral  Axillary  SpO2: 100% 100% 100% 100%  Weight:        Intake/Output Summary (Last 24 hours) at 09/26/2018 1023 Last data filed at 09/26/2018 0545 Gross per 24 hour  Intake 1100.52 ml  Output 2000 ml  Net -899.48 ml   Filed Weights   09/25/18 1245  Weight: 47.8 kg    Examination:  Constitutional: Minimally responsive, nonverbal, right shoulder and arm twitching Eyes: PERRL ENMT: Mucous membranes are moist.  Neck: normal, supple Respiratory: clear to auscultation bilaterally, no wheezing, no crackles.  Shallow respiratory effort Cardiovascular: Regular rate and rhythm, no murmurs / rubs / gallops. No LE edema.  Abdomen: no tenderness. Bowel sounds positive.  PEG tube in place Musculoskeletal: no clubbing / cyanosis. Skin: no rashes Neurologic: Unresponsive, right shoulder and right arm twitching Psychiatric: Unable to assess   Data Reviewed: I have independently reviewed following  labs and imaging studies   CBC: Recent Labs  Lab 09/25/18 1412 09/26/18 0455  WBC 9.9 8.8  NEUTROABS 7.4 5.8  HGB 11.4* 11.2*  HCT 34.3* 34.7*  MCV 85.8 88.3  PLT 345 324   Basic Metabolic Panel: Recent Labs  Lab  09/25/18 1412 09/26/18 0455  NA 138 139  K 3.6 3.6  CL 109 111  CO2 17* 16*  GLUCOSE 95 98  BUN 8 6  CREATININE 0.61 0.78  CALCIUM 9.0 9.0  MG 1.8  --   PHOS 3.5  --    GFR: CrCl cannot be calculated (Unknown ideal weight.). Liver Function Tests: Recent Labs  Lab 09/25/18 1412 09/26/18 0455  AST 18 19  ALT 24 22  ALKPHOS 90 75  BILITOT 0.8 0.6  PROT 6.5 6.1*  ALBUMIN 2.9* 2.6*   No results for input(s): LIPASE, AMYLASE in the last 168 hours. No results for input(s): AMMONIA in the last 168 hours. Coagulation Profile: Recent Labs  Lab 09/25/18 1412  INR 1.16   Cardiac Enzymes: No results for input(s): CKTOTAL, CKMB, CKMBINDEX, TROPONINI in the last 168 hours. BNP (last 3 results) No results for input(s): PROBNP in the last 8760 hours. HbA1C: No results for input(s): HGBA1C in the last 72 hours. CBG: No results for input(s): GLUCAP in the last 168 hours. Lipid Profile: No results for input(s): CHOL, HDL, LDLCALC, TRIG, CHOLHDL, LDLDIRECT in the last 72 hours. Thyroid Function Tests: No results for input(s): TSH, T4TOTAL, FREET4, T3FREE, THYROIDAB in the last 72 hours. Anemia Panel: No results for input(s): VITAMINB12, FOLATE, FERRITIN, TIBC, IRON, RETICCTPCT in the last 72 hours. Urine analysis: No results found for: COLORURINE, APPEARANCEUR, LABSPEC, PHURINE, GLUCOSEU, HGBUR, BILIRUBINUR, KETONESUR, PROTEINUR, UROBILINOGEN, NITRITE, LEUKOCYTESUR Sepsis Labs: Invalid input(s): PROCALCITONIN, LACTICIDVEN  Recent Results (from the past 240 hour(s))  MRSA PCR Screening     Status: None   Collection Time: 09/25/18 12:50 PM  Result Value Ref Range Status   MRSA by PCR NEGATIVE NEGATIVE Final    Comment:        The GeneXpert MRSA Assay (FDA approved for NASAL specimens only), is one component of a comprehensive MRSA colonization surveillance program. It is not intended to diagnose MRSA infection nor to guide or monitor treatment for MRSA  infections. Performed at [facility], [address] [place], [place] [address]       Radiology Studies: Dg Chest Port 1 View  Result Date: 09/26/2018 CLINICAL DATA:  Fever EXAM: PORTABLE CHEST 1 VIEW COMPARISON:  03/26/2010 FINDINGS: Heart and mediastinal contours are within normal limits. No focal opacities or effusions. No acute bony abnormality. IMPRESSION: No active disease. Electronically Signed   By: Charlett NoseKevin  Dover M.D.   On: 09/26/2018 07:32    Pamella Pertostin Devonne Lalani, MD, PhD [facility] Pager [phone]  If 7PM-7AM, please contact night-coverage [email or web address] Password [email or web address] 09/26/2018, 10:23 AM

## 2018-09-27 LAB — CBC WITH DIFFERENTIAL/PLATELET
Abs Immature Granulocytes: 0.09 10*3/uL — ABNORMAL HIGH (ref 0.00–0.07)
Basophils Absolute: 0.1 10*3/uL (ref 0.0–0.1)
Basophils Relative: 1 %
Eosinophils Absolute: 0.2 10*3/uL (ref 0.0–0.5)
Eosinophils Relative: 2 %
HCT: 31.9 % — ABNORMAL LOW (ref 39.0–52.0)
Hemoglobin: 10.9 g/dL — ABNORMAL LOW (ref 13.0–17.0)
Immature Granulocytes: 1 %
Lymphocytes Relative: 20 %
Lymphs Abs: 2.1 10*3/uL (ref 0.7–4.0)
MCH: 28.2 pg (ref 26.0–34.0)
MCHC: 34.2 g/dL (ref 30.0–36.0)
MCV: 82.6 fL (ref 80.0–100.0)
Monocytes Absolute: 1 10*3/uL (ref 0.1–1.0)
Monocytes Relative: 9 %
Neutro Abs: 7.4 10*3/uL (ref 1.7–7.7)
Neutrophils Relative %: 67 %
Platelets: 351 10*3/uL (ref 150–400)
RBC: 3.86 MIL/uL — ABNORMAL LOW (ref 4.22–5.81)
RDW: 13.9 % (ref 11.5–15.5)
WBC: 10.9 10*3/uL — ABNORMAL HIGH (ref 4.0–10.5)
nRBC: 0 % (ref 0.0–0.2)

## 2018-09-27 LAB — BASIC METABOLIC PANEL
Anion gap: 11 (ref 5–15)
BUN: <5 mg/dL — ABNORMAL LOW (ref 6–20)
CO2: 18 mmol/L — ABNORMAL LOW (ref 22–32)
Calcium: 8.7 mg/dL — ABNORMAL LOW (ref 8.9–10.3)
Chloride: 109 mmol/L (ref 98–111)
Creatinine, Ser: 0.53 mg/dL — ABNORMAL LOW (ref 0.61–1.24)
Glucose, Bld: 100 mg/dL — ABNORMAL HIGH (ref 70–99)
Potassium: 3.6 mmol/L (ref 3.5–5.1)
SODIUM: 138 mmol/L (ref 135–145)

## 2018-09-27 LAB — URINE CULTURE

## 2018-09-27 LAB — INFLUENZA PANEL BY PCR (TYPE A & B)
Influenza A By PCR: NEGATIVE
Influenza B By PCR: NEGATIVE

## 2018-09-27 MED ORDER — ACETAMINOPHEN 650 MG RE SUPP
650.0000 mg | Freq: Four times a day (QID) | RECTAL | Status: DC | PRN
  Administered 2018-09-27 – 2018-10-04 (×3): 650 mg via RECTAL
  Filled 2018-09-27 (×4): qty 1

## 2018-09-27 MED ORDER — THIAMINE HCL 100 MG/ML IJ SOLN
500.0000 mg | Freq: Three times a day (TID) | INTRAVENOUS | Status: DC
  Administered 2018-09-27 – 2018-10-02 (×18): 500 mg via INTRAVENOUS
  Filled 2018-09-27 (×21): qty 5

## 2018-09-27 NOTE — Progress Notes (Signed)
Reason for consult: Altered mental status, right upper extremity jerking  Subjective: Patient not significantly better compared to yesterday.  Has remained afebrile.  Still not speaking, although there was reported patient said a few words to the nurse last night. He needs to have intermittent right upper extremity jerking, usually provoked with stimulation.    ROS:  Unable to obtain due to poor mental status  Examination  Vital signs in last 24 hours: Temp:  [97.5 F (36.4 C)-99.2 F (37.3 C)] 99.2 F (37.3 C) (12/21 0730) Pulse Rate:  [95-115] 115 (12/21 0730) Resp:  [15-26] 17 (12/21 0730) BP: (112-119)/(74-94) 116/74 (12/21 0730) SpO2:  [99 %-100 %] 100 % (12/21 0730)  General: lying in bed CVS: pulse-normal rate and rhythm RS: breathing comfortably Extremities: normal   Neuro: MS: Alert, not oriented.  Nonverbal and not following any commands. does not track examiner CN: pupils equal and reactive, face symmetric, tongue midline Motor: Reduced tone in the right upper extremity with intermittent rhythmic jerking.  Left upper extremity has increased tone and moves antigravity.  Bilateral spastic paraparesis.  Coordination: not assessed Gait: not tested  Basic Metabolic Panel: Recent Labs  Lab 09/25/18 1412 09/26/18 0455 09/27/18 0303  NA 138 139 138  K 3.6 3.6 3.6  CL 109 111 109  CO2 17* 16* 18*  GLUCOSE 95 98 100*  BUN 8 6 <5*  CREATININE 0.61 0.78 0.53*  CALCIUM 9.0 9.0 8.7*  MG 1.8  --   --   PHOS 3.5  --   --     CBC: Recent Labs  Lab 09/25/18 1412 09/26/18 0455 09/27/18 0743  WBC 9.9 8.8 10.9*  NEUTROABS 7.4 5.8 7.4  HGB 11.4* 11.2* 10.9*  HCT 34.3* 34.7* 31.9*  MCV 85.8 88.3 82.6  PLT 345 324 351     Coagulation Studies: Recent Labs    09/25/18 1412  LABPROT 14.7  INR 1.16    Imaging Reviewed: MRI Brain: Area of restriction diffusion in the left frontal lobe.    ASSESSMENT AND PLAN  Leigh Syndrome Focal status epilepticus     Will start patient on thiamine  500mg  TID Continue Dilantin, Vimpat and Keppra, Avoid Valproic acid Will continue to watch for improvement, if not [facility].    Georgiana SpinnerSushanth Aroor [facility] Pager Number [phone] For questions after 7pm please refer to AMION to reach the Neurologist on call

## 2018-09-27 NOTE — Progress Notes (Signed)
Droplet precaution added until the rule out of influenza.  Kit sent via lab and sample collected and tubed out.

## 2018-09-27 NOTE — Progress Notes (Signed)
Mom still concerned about PEG and feeding. Doctor available to answer questions concerning.   Mom later called that service on her own and found some peace with whatever they told her.   Mom educated on droplet precaution and states "this is the 3rd or 4th time he has been tested in a month and he has been negative".   Mom expressed that because of personal beliefs her nor patient do or have ever received a flu shot.

## 2018-09-27 NOTE — Progress Notes (Addendum)
PROGRESS NOTE    Jason Heaterhaddeus R Cahalan Jr.  WUJ:[id number] DOB: [birth year] DOA: 09/25/2018 PCP: [name], [name], MD     Brief Narrative:  Jason Heaterhaddeus R Kassner Jr. is a 27 year old male with history of cerebral palsy, mitochondrial Leigh syndrome, pressure ulcers, feeding tube, was [facility] after presenting there with fevers and uncontrollable right upper extremity movements concerning for seizures.  At baseline he is able to communicate and uses a motorized wheelchair.  He participates in group activities and apparently he performs really well.  Since last Tuesday he has been nonverbal and has been having twitching movements of the right upper extremity and face.   New events last 24 hours / Subjective: No acute events overnight. Mother at bedside. Patient remains unresponsive on my examination but per mother, patient was more awake previously this morning. 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 Avoid valproic acid.  -Seizure precautions  -Neurology following  -IV thiamine started   Fever -Patient was started empirically on acyclovir, aztreonam and vancomycin -Fever 100.4 this morning, WBC 10.9   -UA negative for infection, urine culture with multiple bacterial morphotypes present -Check blood culture, flu panel    Mitochondrial Leigh syndrome -Resume levocarnitine once PEG tube malfunction resolved  PEG tube malfunction -IR to evaluate   DVT prophylaxis: SCDs Code Status: Full Family Communication: Mother at  bedside Disposition Plan: Pending improvement   Consultants:   Neurology  Procedures:   Continuous EEG   Antimicrobials:  Anti-infectives (From admission, onward)   Start     Dose/Rate Route Frequency Ordered Stop   09/26/18 1100  vancomycin (VANCOCIN) 500 mg in sodium chloride 0.9 % 100 mL IVPB     500 mg 100 mL/hr over 60 Minutes Intravenous Every 8 hours 09/26/18 1015     09/25/18 1700  vancomycin (VANCOCIN) IVPB 1000 mg/200 mL premix     1,000 mg 200 mL/hr over 60 Minutes Intravenous  Once 09/25/18 1608 09/25/18 2000   09/25/18 1700  aztreonam (AZACTAM) 2 g in sodium chloride 0.9 % 100 mL IVPB     2 g 200 mL/hr over 30 Minutes Intravenous Every 8 hours 09/25/18 1608     09/25/18 1700  acyclovir (ZOVIRAX) 450 mg in dextrose 5 % 100 mL IVPB     450 mg 109 mL/hr over 60 Minutes Intravenous Every 8 hours 09/25/18 1608          Objective: Vitals:   09/26/18 2356 09/27/18 0300 09/27/18 0730 09/27/18 1104  BP: 119/84 114/76 116/74 115/82  Pulse: (!) 108 (!) 112 (!) 115 (!) 108  Resp: 16 (!) 26 17 20   Temp: 98.1 F (36.7 C) 98.1 F (36.7 C) 99.2 F (37.3 C) (!) 100.4 F (38 C)  TempSrc: Axillary Axillary Axillary Axillary  SpO2: 100% 99% 100% 98%  Weight:        Intake/Output Summary (Last 24 hours) at 09/27/2018 1320 Last data filed at 09/27/2018 1109 Gross per 24 hour  Intake 2294.75 ml  Output 1550 ml  Net 744.75 ml   Filed Weights   09/25/18 1245  Weight:  47.8 kg    Examination:  General exam: Appears calm  Respiratory system: Clear to auscultation. Respiratory effort normal. Cardiovascular system: S1 & S2 heard, tachycardic, regular rhythm. No JVD, murmurs, rubs, gallops or clicks. No pedal edema. Gastrointestinal system: Abdomen is nondistended, soft Extremities: +Contractures of extremities Skin: No rashes, lesions or ulcers on exposed skin   Data Reviewed: I have personally reviewed following labs and imaging studies  CBC: Recent Labs  Lab  09/25/18 1412 09/26/18 0455 09/27/18 0743  WBC 9.9 8.8 10.9*  NEUTROABS 7.4 5.8 7.4  HGB 11.4* 11.2* 10.9*  HCT 34.3* 34.7* 31.9*  MCV 85.8 88.3 82.6  PLT 345 324 351   Basic Metabolic Panel: Recent Labs  Lab 09/25/18 1412 09/26/18 0455 09/27/18 0303  NA 138 139 138  K 3.6 3.6 3.6  CL 109 111 109  CO2 17* 16* 18*  GLUCOSE 95 98 100*  BUN 8 6 <5*  CREATININE 0.61 0.78 0.53*  CALCIUM 9.0 9.0 8.7*  MG 1.8  --   --   PHOS 3.5  --   --    GFR: CrCl cannot be calculated (Unknown ideal weight.). Liver Function Tests: Recent Labs  Lab 09/25/18 1412 09/26/18 0455  AST 18 19  ALT 24 22  ALKPHOS 90 75  BILITOT 0.8 0.6  PROT 6.5 6.1*  ALBUMIN 2.9* 2.6*   No results for input(s): LIPASE, AMYLASE in the last 168 hours. No results for input(s): AMMONIA in the last 168 hours. Coagulation Profile: Recent Labs  Lab 09/25/18 1412  INR 1.16   Cardiac Enzymes: No results for input(s): CKTOTAL, CKMB, CKMBINDEX, TROPONINI in the last 168 hours. BNP (last 3 results) No results for input(s): PROBNP in the last 8760 hours. HbA1C: No results for input(s): HGBA1C in the last 72 hours. CBG: No results for input(s): GLUCAP in the last 168 hours. Lipid Profile: No results for input(s): CHOL, HDL, LDLCALC, TRIG, CHOLHDL, LDLDIRECT in the last 72 hours. Thyroid Function Tests: No results for input(s): TSH, T4TOTAL, FREET4, T3FREE, THYROIDAB in the last 72 hours. Anemia Panel: No results for input(s): VITAMINB12, FOLATE, FERRITIN, TIBC, IRON, RETICCTPCT in the last 72 hours. Sepsis Labs: No results for input(s): PROCALCITON, LATICACIDVEN in the last 168 hours.  Recent Results (from the past 240 hour(s))  MRSA PCR Screening     Status: None   Collection Time: 09/25/18 12:50 PM  Result Value Ref Range Status   MRSA by PCR NEGATIVE NEGATIVE Final    Comment:        The GeneXpert MRSA Assay (FDA approved for NASAL specimens only), is one component of a comprehensive MRSA  colonization surveillance program. It is not intended to diagnose MRSA infection nor to guide or monitor treatment for MRSA infections. Performed at [facility], [address] [place], [place] [address]   Culture, Urine     Status: None   Collection Time: 09/26/18  2:10 PM  Result Value Ref Range Status   Specimen Description URINE, CLEAN CATCH  Final   Special Requests   Final    NONE Performed at [facility], [address] [place], [place] [address]    Culture   Final    Multiple bacterial morphotypes present, none predominant. Suggest appropriate recollection if clinically indicated.   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 Electronically Signed   By: Awilda Metro M.D.   On: 09/26/2018 23:29   Result Date: 09/26/2018 CLINICAL DATA:  Encephalopathy. History of Leigh syndrome/mitochondrial disorder. EXAM: MRI HEAD WITHOUT AND WITH CONTRAST TECHNIQUE: Multiplanar, multiecho pulse sequences of the brain and surrounding structures were obtained without and with intravenous contrast. CONTRAST:  4 cc Gadavist COMPARISON:  MRI head November 06, 2005 FINDINGS: Moderately motion degraded examination. INTRACRANIAL CONTENTS: Rounded reduced diffusion LEFT frontal cortex with T2 shine and central nonenhancing low ADC values with enhancement. Symmetric FLAIR T2 hyperintense signal and volume loss bilateral basal ganglia and hippocampi. Severe pontocerebellar atrophy, progressed from 2007. No midline shift, mass effect or masses. No hydrocephalus. No abnormal extra-axial fluid collections or extra-axial enhancement. VASCULAR: Normal major intracranial vascular flow voids present at skull base. SKULL AND UPPER CERVICAL SPINE: No abnormal sellar expansion. No suspicious calvarial bone marrow signal.  Craniocervical junction maintained. SINUSES/ORBITS: Mild LEFT maxillary sinus mucosal thickening. Mastoid air cells are well aerated. Included ocular globes and orbital contents are non-suspicious. OTHER: None. IMPRESSION: 1. Moderately motion degraded examination. LEFT frontal cortical lesion with imaging characteristics of subacute Leigh lesion or other demyelination. 2. Symmetrically atrophic basal ganglia and hippocampi associated seen with Leigh syndrome though, other neurodegenerative syndromes can have this appearance. 3. Severe Pontocerebellar atrophy progressed from 2007. Electronically Signed: By: Awilda Metro M.D. On: 09/26/2018 23:19   Dg Chest Port 1 View  Result Date: 09/26/2018 CLINICAL DATA:  Fever EXAM: PORTABLE CHEST 1 VIEW COMPARISON:  03/26/2010 FINDINGS: Heart and mediastinal contours are within normal limits. No focal opacities or effusions. No acute bony abnormality. IMPRESSION: No active disease. Electronically Signed   By: Charlett Nose M.D.   On: 09/26/2018 07:32      Scheduled Meds: . phenytoin (DILANTIN) IV  100 mg Intravenous Q8H   Continuous Infusions: . 0.9 % NaCl with KCl 20 mEq / L 75 mL/hr at 09/26/18 0914  . acyclovir 450 mg (09/27/18 1610)  . aztreonam 2 g (09/27/18 1128)  . dextrose 5 % and 0.9% NaCl 50 mL/hr at 09/27/18 0230  . lacosamide (VIMPAT) IV 200 mg (09/27/18 0528)  . levETIRAcetam 1,500 mg (09/27/18 0447)  . thiamine injection    . vancomycin 500 mg (09/27/18 0948)     LOS: 2 days    Time spent: 40 minutes   Noralee Stain, DO [facility] [email or web address] Password [email or web address] 09/27/2018, 1:20 PM

## 2018-09-27 NOTE — Progress Notes (Addendum)
Nurse will discontinue droplet precaution which were added to rule out influenza; influenza results are negative.

## 2018-09-27 NOTE — Progress Notes (Signed)
Temp down to 97.9 from 100.4 after rectal suppository.  Blood culture collected, no need for a foot draw.

## 2018-09-28 LAB — BASIC METABOLIC PANEL
Anion gap: 9 (ref 5–15)
BUN: <5 mg/dL — ABNORMAL LOW (ref 6–20)
CO2: 17 mmol/L — ABNORMAL LOW (ref 22–32)
Calcium: 8.5 mg/dL — ABNORMAL LOW (ref 8.9–10.3)
Chloride: 114 mmol/L — ABNORMAL HIGH (ref 98–111)
Creatinine, Ser: 0.62 mg/dL (ref 0.61–1.24)
GFR calc Af Amer: >60 mL/min (ref >60)
GFR calc non Af Amer: >60 mL/min (ref >60)
GLUCOSE: 100 mg/dL — AB (ref 70–99)
Potassium: 3.1 mmol/L — ABNORMAL LOW (ref 3.5–5.1)
Sodium: 140 mmol/L (ref 135–145)

## 2018-09-28 LAB — CBC
HEMATOCRIT: 27.2 % — AB (ref 39.0–52.0)
Hemoglobin: 8.9 g/dL — ABNORMAL LOW (ref 13.0–17.0)
MCH: 28.8 pg (ref 26.0–34.0)
MCHC: 32.7 g/dL (ref 30.0–36.0)
MCV: 88 fL (ref 80.0–100.0)
Platelets: 289 10*3/uL (ref 150–400)
RBC: 3.09 MIL/uL — ABNORMAL LOW (ref 4.22–5.81)
RDW: 14 % (ref 11.5–15.5)
WBC: 7.6 10*3/uL (ref 4.0–10.5)
nRBC: 0 % (ref 0.0–0.2)

## 2018-09-28 LAB — VANCOMYCIN, TROUGH: Vancomycin Tr: 17 ug/mL (ref 15–20)

## 2018-09-28 LAB — MAGNESIUM: Magnesium: 1.7 mg/dL (ref 1.7–2.4)

## 2018-09-28 MED ORDER — POTASSIUM CHLORIDE 10 MEQ/100ML IV SOLN
10.0000 meq | INTRAVENOUS | Status: AC
  Administered 2018-09-28 (×6): 10 meq via INTRAVENOUS
  Filled 2018-09-28 (×5): qty 100

## 2018-09-28 MED ORDER — SODIUM CHLORIDE 0.9% FLUSH
10.0000 mL | Freq: Two times a day (BID) | INTRAVENOUS | Status: DC
  Administered 2018-09-28 – 2018-10-01 (×6): 10 mL

## 2018-09-28 MED ORDER — SODIUM CHLORIDE 0.9% FLUSH: 10.0000 mL | INTRAVENOUS | Status: DC | PRN

## 2018-09-28 NOTE — Progress Notes (Addendum)
PROGRESS NOTE    Jason Glover.  WUJ:[id number] DOB: 28-May-1991 DOA: 09/25/2018 PCP: Johny Blamer, MD     Brief Narrative:  Jason Glover. is a 27 year old male with history of cerebral palsy, mitochondrial Leigh syndrome, pressure ulcers, feeding tube, was transferred from [facility] after presenting there with fevers and uncontrollable right upper extremity movements concerning for seizures.  At baseline he is able to communicate and uses a motorized wheelchair.  He participates in group activities and apparently he performs really well.  Since last Tuesday he has been nonverbal and has been having twitching movements of the right upper extremity and face.   New events last 24 hours / Subjective: Mother at bedside tells me patient has been opening his eyes, giving thumbs up sign to certain questions.   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 Avoid valproic acid.  -Seizure precautions  -Neurology following  -IV thiamine started   Fever -Patient was started empirically on acyclovir, aztreonam and vancomycin. Stop vanco.  -Now has been afebrile last 24 hours    -UA negative for infection, urine culture with multiple bacterial morphotypes present -Check blood culture - pending -Flu negative    Mitochondrial Leigh syndrome -Resume levocarnitine once PEG tube malfunction resolved  PEG tube displacement  -IR to evaluate -NGT for nutrition in the meanwhile    DVT prophylaxis: SCDs Code Status: Full Family Communication: Mother at bedside Disposition Plan:  Pending improvement   Consultants:   Neurology  Procedures:   Continuous EEG   Antimicrobials:  Anti-infectives (From admission, onward)   Start     Dose/Rate Route Frequency Ordered Stop   09/26/18 1100  vancomycin (VANCOCIN) 500 mg in sodium chloride 0.9 % 100 mL IVPB     500 mg 100 mL/hr over 60 Minutes Intravenous Every 8 hours 09/26/18 1015     09/25/18 1700  vancomycin (VANCOCIN) IVPB 1000 mg/200 mL premix     1,000 mg 200 mL/hr over 60 Minutes Intravenous  Once 09/25/18 1608 09/25/18 2000   09/25/18 1700  aztreonam (AZACTAM) 2 g in sodium chloride 0.9 % 100 mL IVPB     2 g 200 mL/hr over 30 Minutes Intravenous Every 8 hours 09/25/18 1608     09/25/18 1700  acyclovir (ZOVIRAX) 450 mg in dextrose 5 % 100 mL IVPB     450 mg 109 mL/hr over 60 Minutes Intravenous Every 8 hours 09/25/18 1608         Objective: Vitals:   09/27/18 1943 09/27/18 2300 09/28/18 0500 09/28/18 0900  BP: 99/69 104/76 102/60 (!) 96/55  Pulse: 100 (!) 106 98 (!) 103  Resp: 18 18 20 20   Temp: 98.2 F (36.8 C) (!) 97.5 F (36.4 C) 98 F (36.7 C) 97.7 F (36.5 C)  TempSrc: Axillary Axillary Axillary Oral  SpO2: 100% 100% 100% 100%  Weight:        Intake/Output Summary (Last 24 hours) at 09/28/2018 1202 Last data filed at 09/28/2018 0900 Gross per 24 hour  Intake 1964.93 ml  Output 650 ml  Net 1314.93 ml   Filed Weights   09/25/18 1245  Weight: 47.8 kg    Examination: General exam: Appears calm and comfortable  Respiratory system: Clear to auscultation. Respiratory effort normal. Cardiovascular system: S1 & S2 heard, tachycardic, regular rhythm. No JVD, murmurs, rubs, gallops or clicks. No pedal edema. Gastrointestinal system: Abdomen is nondistended, soft and +PEG in place  Central nervous system: Alert to bright light, nonverbal, noninteractive  Extremities: +Contractures of extremities  Skin: No rashes, lesions or ulcers on exposed skin     Data Reviewed: I have personally  reviewed following labs and imaging studies  CBC: Recent Labs  Lab 09/25/18 1412 09/26/18 0455 09/27/18 0743 09/28/18 0733  WBC 9.9 8.8 10.9* 7.6  NEUTROABS 7.4 5.8 7.4  --   HGB 11.4* 11.2* 10.9* 8.9*  HCT 34.3* 34.7* 31.9* 27.2*  MCV 85.8 88.3 82.6 88.0  PLT 345 324 351 289   Basic Metabolic Panel: Recent Labs  Lab 09/25/18 1412 09/26/18 0455 09/27/18 0303 09/28/18 0523 09/28/18 0733  NA 138 139 138 140  --   K 3.6 3.6 3.6 3.1*  --   CL 109 111 109 114*  --   CO2 17* 16* 18* 17*  --   GLUCOSE 95 98 100* 100*  --   BUN 8 6 <5* <5*  --   CREATININE 0.61 0.78 0.53* 0.62  --   CALCIUM 9.0 9.0 8.7* 8.5*  --   MG 1.8  --   --   --  1.7  PHOS 3.5  --   --   --   --    GFR: CrCl cannot be calculated (Unknown ideal weight.). Liver Function Tests: Recent Labs  Lab 09/25/18 1412 09/26/18 0455  AST 18 19  ALT 24 22  ALKPHOS 90 75  BILITOT 0.8 0.6  PROT 6.5 6.1*  ALBUMIN 2.9* 2.6*   No results for input(s): LIPASE, AMYLASE in the last 168 hours. No results for input(s): AMMONIA in the last 168 hours. Coagulation Profile: Recent Labs  Lab 09/25/18 1412  INR 1.16   Cardiac Enzymes: No results for input(s): CKTOTAL, CKMB, CKMBINDEX, TROPONINI in the last 168 hours. BNP (last 3 results) No results for input(s): PROBNP in the last 8760 hours. HbA1C: No results for input(s): HGBA1C in the last 72 hours. CBG: No results for input(s): GLUCAP in the last 168 hours. Lipid Profile: No results for input(s): CHOL, HDL, LDLCALC, TRIG, CHOLHDL, LDLDIRECT in the last 72 hours. Thyroid Function Tests: No results for input(s): TSH, T4TOTAL, FREET4, T3FREE, THYROIDAB in the last 72 hours. Anemia Panel: No results for input(s): VITAMINB12, FOLATE, FERRITIN, TIBC, IRON, RETICCTPCT in the last 72 hours. Sepsis Labs: No results for input(s): PROCALCITON, LATICACIDVEN in the last 168 hours.  Recent Results (from the past 240 hour(s))  MRSA PCR Screening     Status: None    Collection Time: 09/25/18 12:50 PM  Result Value Ref Range Status   MRSA by PCR NEGATIVE NEGATIVE Final    Comment:        The GeneXpert MRSA Assay (FDA approved for NASAL specimens only), is one component of a comprehensive MRSA colonization surveillance program. It is not intended to diagnose MRSA infection nor to guide or monitor treatment for MRSA infections. Performed at [facility], [address] [place], [place] [address]   Culture, Urine     Status: None   Collection Time: 09/26/18  2:10 PM  Result Value Ref Range Status   Specimen Description URINE, CLEAN CATCH  Final   Special Requests   Final  NONE Performed at [facility], [address] [place], [place] [address]    Culture   Final    Multiple bacterial morphotypes present, none predominant. Suggest appropriate recollection if clinically indicated.   Report Status 09/27/2018 FINAL  Final  Culture, blood (routine x 2)     Status: None (Preliminary result)   Collection Time: 09/27/18  2:20 PM  Result Value Ref Range Status   Specimen Description BLOOD RIGHT HAND  Final   Special Requests AEROBIC BOTTLE ONLY Blood Culture adequate volume  Final   Culture   Final    NO GROWTH < 24 HOURS Performed at [facility], [address] [place], [place] [address]    Report Status PENDING  Incomplete  Culture, blood (routine x 2)     Status: None (Preliminary result)   Collection Time: 09/27/18  2:25 PM  Result Value Ref Range Status   Specimen Description BLOOD RIGHT HAND  Final   Special Requests   Final    AEROBIC BOTTLE ONLY Blood Culture results may not be optimal due to an excessive volume of blood received in culture bottles   Culture   Final    NO GROWTH < 24 HOURS Performed at [facility], 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 Electronically Signed   By: Awilda Metroourtnay  Bloomer M.D.   On: 09/26/2018 23:29   Result Date: 09/26/2018 CLINICAL DATA:  Encephalopathy. History of Leigh syndrome/mitochondrial disorder. EXAM: MRI HEAD WITHOUT AND WITH CONTRAST TECHNIQUE: Multiplanar, multiecho pulse sequences of the brain and surrounding structures were obtained without and with intravenous contrast. CONTRAST:  4 cc Gadavist COMPARISON:  MRI head November 06, 2005 FINDINGS: Moderately motion degraded examination. INTRACRANIAL CONTENTS: Rounded reduced diffusion LEFT frontal cortex with T2 shine and central nonenhancing low ADC values with enhancement. Symmetric FLAIR T2 hyperintense signal and volume loss bilateral basal ganglia and hippocampi. Severe pontocerebellar atrophy, progressed from 2007. No midline shift, mass effect or masses. No hydrocephalus. No abnormal extra-axial fluid collections or extra-axial enhancement. VASCULAR: Normal major intracranial vascular flow voids present at skull base. SKULL AND UPPER CERVICAL SPINE: No abnormal sellar expansion. No suspicious calvarial bone marrow signal. Craniocervical junction maintained. SINUSES/ORBITS: Mild LEFT maxillary sinus mucosal thickening. Mastoid air cells are well aerated. Included ocular globes and orbital contents are non-suspicious. OTHER: None. IMPRESSION: 1. Moderately motion degraded examination. LEFT frontal cortical lesion with imaging characteristics of subacute Leigh lesion or other demyelination. 2. Symmetrically atrophic basal ganglia and hippocampi associated seen with Leigh syndrome though, other neurodegenerative syndromes can have this appearance. 3. Severe Pontocerebellar atrophy progressed from 2007. Electronically Signed: By: Awilda Metroourtnay  Bloomer M.D. On: 09/26/2018 23:19      Scheduled Meds: . phenytoin (DILANTIN) IV  100 mg Intravenous Q8H  . sodium chloride flush  10-40 mL Intracatheter Q12H    Continuous Infusions: . acyclovir 450 mg (09/28/18 0640)  . aztreonam 2 g (09/28/18 0525)  . dextrose 5 % and 0.9% NaCl 50 mL/hr at 09/28/18 0640  . lacosamide (VIMPAT) IV 200 mg (09/28/18 0718)  . levETIRAcetam 1,500 mg (09/28/18 0646)  . potassium chloride 10 mEq (09/28/18 1057)  . thiamine injection 500 mg (09/28/18 1027)  . vancomycin 500 mg (09/28/18 1059)     LOS: 3  days    Time spent: 25 minutes   Noralee StainJennifer Edmundo Tedesco, DO [facility] [email or web address] Password [email or web address] 09/28/2018, 12:02 PM

## 2018-09-28 NOTE — Progress Notes (Signed)
Spoke to mother at length about the patients G tube. She stated that the tube was not dislodged. In the ambulance ride the Nurse or tech stepped on the feeding tubing and the tube did come out, however she put it back in, the patient does need the tube changed because the balloon did rupture. Mom does all of this for her son at home, she is well educated and understands everything that he needs. Please take the time to listen to her, she is his caretaker. I did listen to his abdomen and gave a small air bolus to check placement, It did sound as though the tube was in place, maybe we can get a KUB to check. Mother is really concerned that the patient has gone without nutrition for 6 days now.

## 2018-09-28 NOTE — Progress Notes (Signed)
Reason for consult: AMS, Seizure  Subjective: No significant change in neurological exam.  Patient appears pretty somnolent, however to sternal rub patient awakes.  This provokes right arm myoclonic jerks and twitching in his right upper face.   ROS: Unable to obtain due to poor mental status  Examination  Vital signs in last 24 hours: Temp:  [97.5 F (36.4 C)-98.4 F (36.9 C)] 98.4 F (36.9 C) (12/22 1200) Pulse Rate:  [98-107] 107 (12/22 1200) Resp:  [17-20] 18 (12/22 1200) BP: (96-117)/(55-84) 117/75 (12/22 1200) SpO2:  [100 %] 100 % (12/22 1200)  General: lying in bed CVS: pulse-normal rate and rhythm RS: breathing comfortably Extremities: normal   Neuro: MS: Somnolent, opens eyes to stimulation CN: pupils equal and reactive,  EOMI, face symmetric, tongue midline, normal sensation over face Motor: Withdraws in both extremities to pain.  Contraction bilateral legs with increased tone. Coordination: normal Gait: not tested  Basic Metabolic Panel: Recent Labs  Lab 09/25/18 1412 09/26/18 0455 09/27/18 0303 09/28/18 0523 09/28/18 0733  NA 138 139 138 140  --   K 3.6 3.6 3.6 3.1*  --   CL 109 111 109 114*  --   CO2 17* 16* 18* 17*  --   GLUCOSE 95 98 100* 100*  --   BUN 8 6 <5* <5*  --   CREATININE 0.61 0.78 0.53* 0.62  --   CALCIUM 9.0 9.0 8.7* 8.5*  --   MG 1.8  --   --   --  1.7  PHOS 3.5  --   --   --   --     CBC: Recent Labs  Lab 09/25/18 1412 09/26/18 0455 09/27/18 0743 09/28/18 0733  WBC 9.9 8.8 10.9* 7.6  NEUTROABS 7.4 5.8 7.4  --   HGB 11.4* 11.2* 10.9* 8.9*  HCT 34.3* 34.7* 31.9* 27.2*  MCV 85.8 88.3 82.6 88.0  PLT 345 324 351 289     Coagulation Studies: No results for input(s): LABPROT, INR in the last 72 hours.  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   Patient has been nonverbal since Tuesday.  He is no longer febrile and is being treated with antibiotics.  Urine culture with multiple bacteria.  Patient has been treated with 3 antiepileptics with some improvement with right facial twitching and right upper extremity myoclonus.  Long-term EEG was obtained which showed no epileptiform activity.   Leigh Syndrome Focal status epilepticus  Metabolic encephalopathy   Will start patient on thiamine  500mg  TID Continue Dilantin, Vimpat and Keppra, Avoid Valproic acid for possible focal status epilepticus-no EEG correlate.  MRI brain shows focus of restriction diffusion in the left frontal cortex which may represent ongoing seizures.  We will not escalate antiepileptic treatment, as there was no EEG correlate. Patient currently not getting feeds due to misplaced PEG tube, his mother states that in the past when patient does not receive feeds he becomes lethargic.  Georgiana SpinnerSushanth Fin Hupp [facility] Pager Number [phone] For questions after 7pm please refer to AMION to reach the Neurologist on call

## 2018-09-28 NOTE — Progress Notes (Signed)
Aware of IR request for possible image-guided percutaneous gastrostomy tube exchange.  Discussed case with patient's mother. Explained that we are planning on procedure tomorrow 09/29/2018 pending scheduling. Also informed her that we might not have appropriate PEG button size and if we do not, it will need to be ordered, and procedure will occur at future date. She conveys understanding.  Waylan Bogalexandra M Louk, PA-C 09/28/2018, 12:11 PM

## 2018-09-29 ENCOUNTER — Inpatient Hospital Stay (HOSPITAL_COMMUNITY): Payer: Medicaid Other

## 2018-09-29 DIAGNOSIS — E43 Unspecified severe protein-calorie malnutrition: Secondary | ICD-10-CM

## 2018-09-29 LAB — BASIC METABOLIC PANEL
Anion gap: 11 (ref 5–15)
BUN: <5 mg/dL — ABNORMAL LOW (ref 6–20)
CO2: 18 mmol/L — ABNORMAL LOW (ref 22–32)
Calcium: 8.5 mg/dL — ABNORMAL LOW (ref 8.9–10.3)
Chloride: 113 mmol/L — ABNORMAL HIGH (ref 98–111)
Creatinine, Ser: 0.59 mg/dL — ABNORMAL LOW (ref 0.61–1.24)
GFR calc Af Amer: >60 mL/min (ref >60)
Glucose, Bld: 106 mg/dL — ABNORMAL HIGH (ref 70–99)
Potassium: 3.4 mmol/L — ABNORMAL LOW (ref 3.5–5.1)
Sodium: 142 mmol/L (ref 135–145)

## 2018-09-29 LAB — CBC
HCT: 28.8 % — ABNORMAL LOW (ref 39.0–52.0)
Hemoglobin: 9.3 g/dL — ABNORMAL LOW (ref 13.0–17.0)
MCH: 27.8 pg (ref 26.0–34.0)
MCHC: 32.3 g/dL (ref 30.0–36.0)
MCV: 86.2 fL (ref 80.0–100.0)
PLATELETS: 338 10*3/uL (ref 150–400)
RBC: 3.34 MIL/uL — ABNORMAL LOW (ref 4.22–5.81)
RDW: 13.9 % (ref 11.5–15.5)
WBC: 8.8 10*3/uL (ref 4.0–10.5)
nRBC: 0 % (ref 0.0–0.2)

## 2018-09-29 LAB — MAGNESIUM: Magnesium: 1.7 mg/dL (ref 1.7–2.4)

## 2018-09-29 LAB — PHOSPHORUS: Phosphorus: 3.2 mg/dL (ref 2.5–4.6)

## 2018-09-29 MED ORDER — JEVITY 1.2 CAL PO LIQD
1000.0000 mL | ORAL | Status: DC
  Administered 2018-09-29: 25 mL/h
  Administered 2018-09-30: 1000 mL
  Filled 2018-09-29 (×2): qty 1000

## 2018-09-29 MED ORDER — JUVEN PO PACK
1.0000 | PACK | Freq: Two times a day (BID) | ORAL | Status: DC
  Administered 2018-09-30 – 2018-10-17 (×33): 1
  Filled 2018-09-29 (×37): qty 1

## 2018-09-29 MED ORDER — FREE WATER
30.0000 mL | Status: DC
  Administered 2018-09-29 – 2018-10-11 (×67): 30 mL

## 2018-09-29 MED ORDER — POTASSIUM CHLORIDE 10 MEQ/100ML IV SOLN
10.0000 meq | INTRAVENOUS | Status: AC
  Administered 2018-09-29 (×4): 10 meq via INTRAVENOUS
  Filled 2018-09-29 (×3): qty 100

## 2018-09-29 MED ORDER — SODIUM CHLORIDE 0.9 % IV SOLN
1.0000 g | Freq: Three times a day (TID) | INTRAVENOUS | Status: AC
  Administered 2018-09-29 – 2018-10-02 (×11): 1 g via INTRAVENOUS
  Filled 2018-09-29 (×11): qty 1

## 2018-09-29 MED ORDER — POTASSIUM CHLORIDE 10 MEQ/100ML IV SOLN
INTRAVENOUS | Status: AC
  Filled 2018-09-29: qty 100

## 2018-09-29 MED ORDER — ACETAMINOPHEN 160 MG/5ML PO SOLN
650.0000 mg | Freq: Four times a day (QID) | ORAL | Status: DC | PRN
  Administered 2018-09-29 – 2018-10-16 (×18): 650 mg
  Filled 2018-09-29 (×19): qty 20.3

## 2018-09-29 NOTE — Progress Notes (Signed)
Reason for consult: Leigh syndrome   Subjective: Patient still not  getting tube feeds.  Going today to get PEG tube evaluated   ROS: negative except above Unable to obtain due to poor mental status  Examination  Vital signs in last 24 hours: Temp:  [97.6 F (36.4 C)-100.5 F (38.1 C)] 98.8 F (37.1 C) (12/23 1614) Pulse Rate:  [94-114] 102 (12/23 1614) Resp:  [15-48] 22 (12/23 1614) BP: (104-120)/(62-78) 120/78 (12/23 1614) SpO2:  [99 %-100 %] 100 % (12/23 1614)  General: lying in bed CVS: pulse-normal rate and rhythm RS: breathing comfortably Extremities: normal   Neuro: MS: Alert, not following any commands CN: pupils equal and reactive,  EOMI, face symmetric,  Motor: Increased tone in all 4 extremities.  Intermittent right upper extremity rhythmic jerking Coordination: Able to assess Gait: not tested  Basic Metabolic Panel: Recent Labs  Lab 09/25/18 1412 09/26/18 0455 09/27/18 0303 09/28/18 0523 09/28/18 0733 09/29/18 0121  NA 138 139 138 140  --  142  K 3.6 3.6 3.6 3.1*  --  3.4*  CL 109 111 109 114*  --  113*  CO2 17* 16* 18* 17*  --  18*  GLUCOSE 95 98 100* 100*  --  106*  BUN 8 6 <5* <5*  --  <5*  CREATININE 0.61 0.78 0.53* 0.62  --  0.59*  CALCIUM 9.0 9.0 8.7* 8.5*  --  8.5*  MG 1.8  --   --   --  1.7 1.7  PHOS 3.5  --   --   --   --  3.2    CBC: Recent Labs  Lab 09/25/18 1412 09/26/18 0455 09/27/18 0743 09/28/18 0733 09/29/18 0121  WBC 9.9 8.8 10.9* 7.6 8.8  NEUTROABS 7.4 5.8 7.4  --   --   HGB 11.4* 11.2* 10.9* 8.9* 9.3*  HCT 34.3* 34.7* 31.9* 27.2* 28.8*  MCV 85.8 88.3 82.6 88.0 86.2  PLT 345 324 351 289 338     Coagulation Studies: No results for input(s): LABPROT, INR in the last 72 hours.  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  Patient has  been nonverbal since Tuesday.  He is no longer febrile and is being treated with antibiotics.  Urine culture with multiple bacteria.  Patient has been treated with 3 antiepileptics with some improvement with right facial twitching and right upper extremity myoclonus.  Long-term EEG was obtained which showed no epileptiform activity.   Leigh Syndrome Focal status epilepticus Metabolic encephalopathy   Continue thiamine prior milligrams 3 times daily Continue Dilantin, Vimpat and Keppra, Avoid Valproic acid for possible focal status epilepticus-no EEG correlate.  Start tube feeds via postpyloric tube   Jason Glover [facility] Pager Number [phone] For questions after 7pm please refer to AMION to reach the Neurologist on call

## 2018-09-29 NOTE — Progress Notes (Signed)
Pharmacy Antibiotic Note  Jason Heaterhaddeus R Grussing Jr. is a 27 y.o. male admitted on 09/25/2018 with FUO. Pharmacy has been consulted for Aztreonam / Acyclovir dosing - day #4. Vancomycin d/c'd 12/22. Tmax/24h 100.5, wbc wnl. SCr stable.  Plan: Aztreonam 2g IV Q8h Acyclovir 450 mg IV Q8h Monitor clinical progress, c/s, renal function F/u de-escalation plan/LOT  Weight: 105 lb 6.1 oz (47.8 kg)  Temp (24hrs), Avg:98.9 F (37.2 C), Min:97.6 F (36.4 C), Max:100.5 F (38.1 C)  Recent Labs  Lab 09/25/18 1412 09/26/18 0455 09/27/18 0303 09/27/18 0743 09/28/18 0523 09/28/18 0733 09/28/18 1049 09/29/18 0121  WBC 9.9 8.8  --  10.9*  --  7.6  --  8.8  CREATININE 0.61 0.78 0.53*  --  0.62  --   --  0.59*  VANCOTROUGH  --   --   --   --   --   --  17  --     CrCl cannot be calculated (Unknown ideal weight.).    Allergies  Allergen Reactions  . Rocephin [Ceftriaxone] Hives   Jason BertinHaley Sharman Glover, PharmD, BCPS Clinical Pharmacist Clinical phone [phone] Please check AMION for all [facility] contact numbers 09/29/2018 11:00 AM

## 2018-09-29 NOTE — Progress Notes (Signed)
Initial Nutrition Assessment  DOCUMENTATION CODES:   Severe malnutrition in context of acute illness/injury  INTERVENTION:   Please resume TF via PEG ASAP: Jevity 1.2 @ 75 mL/hr (goal) - provides 2160 kcal, 100 g protein, and 1458 mL free water - recommend additional 120 mL water flushes q 4 hrs to better meet hydration needs  Monitor for refeeding syndrome   NUTRITION DIAGNOSIS:   Severe Malnutrition related to acute illness as evidenced by moderate fat depletion, severe muscle depletion, energy intake < or equal to 50% for > or equal to 5 days, percent weight loss.  GOAL:   Patient will meet greater than or equal to 90% of their needs  MONITOR:   Diet advancement, Weight trends, PO intake, Skin, TF tolerance  REASON FOR ASSESSMENT:   Consult Enteral/tube feeding initiation and management  ASSESSMENT:   27 yo male, admitted with seizures. PMH significant for mitochondreal Leigh syndrome, pressure ulcers. Has PEG tube. Home meds list includes vitamin B-complex, ferrous sulfate, L-carnitine, Miralax.  Labs: potassium 3.4, chloride 113, glucose 106, BUN <5, Creatinine 0.59, Hbg 9.3, Hct 28.8% Meds: thiamine 500 mg in NS TID  Pt needs new button for PEG (16 Fr, 3.0 cm). Nsg called down to IR - do not have correct size. Pt mom would prefer to fix this problem versus place an NGT via [employer], but is willing to do so if it means pt will get to eat today. IR calling PA to talk to MD about using a different size so it can be replaced this afternoon.  Mom is concerned that if temporary measures are put in place, solving the real problem will be put off indefinitely. Mom is frustrated that she has been handed off multiple times on the issue of nutrition.  At home, pt eats 3 meals daily by mouth. If mom determines he hasn't eaten enough, will supplement with 2 cans of Jevity 1.2. Overnight, pt receives 6 cans of Jevity 1.2 at gravity drip rate. 8 cans Jevity 1.2 daily provides 2288  kcal and 106 g protein, which is sufficient to meet his needs.  Mom reports pt has had nothing to eat for last 8-10 days. Pt UBW 135# --> 30# (22%) wt loss over 1.5-2 months, which is very significant.  Denies recent vomiting. Last BM 12/19.   NUTRITION - FOCUSED PHYSICAL EXAM:   Most Recent Value  Orbital Region  Mild depletion  Upper Arm Region  Severe depletion  Thoracic and Lumbar Region  Unable to assess  Buccal Region  Moderate depletion  Temple Region  Moderate depletion  Clavicle Bone Region  Moderate depletion  Clavicle and Acromion Bone Region  Moderate depletion  Scapular Bone Region  Unable to assess  Dorsal Hand  Unable to assess  Patellar Region  Severe depletion  Anterior Thigh Region  Severe depletion  Posterior Calf Region  Severe depletion  Edema (RD Assessment)  Unable to assess  Hair  Reviewed  Eyes  Unable to assess  Mouth  Unable to assess  Skin  Reviewed  Nails  Reviewed      Diet Order:   Diet Order    None      EDUCATION NEEDS:  Not appropriate for education at this time  Skin:  Skin Assessment: Skin Integrity Issues: Skin Integrity Issues:: Stage I, Unstageable, Stage II Stage I: R foot Stage II: buttocks Unstageable: ankle  Last BM:  12/19, type 6  Height:  Ht Readings from Last 1 Encounters:  09/29/18 5\' 9"  (1.753  m)    Weight:  Wt Readings from Last 1 Encounters:  09/25/18 47.8 kg    Ideal Body Weight:  72.7 kg  BMI:  Body mass index is 15.56 kg/m.  Estimated Nutritional Needs:   Kcal:  [id number] calories daily (30-35 kcal/kg IBW)  Protein:  87-109 gm daily (1.2-1.5 g/kg IBW)  Fluid:  >/= 2.1 L daily or per MD discretion  Jolaine ArtistHannah Jackob [place], MS, RDN, LDN Pager: [phone]

## 2018-09-29 NOTE — Procedures (Signed)
Cortrak  Tube Type:  Cortrak - 43 inches Tube Location:  Left nare Initial Placement:  Postpyloric Secured by: Bridle Technique Used to Measure Tube Placement:  Documented cm marking at nare/ corner of mouth Cortrak Secured At:  78 cm    Cortrak Tube Team Note:  Consult received to place a Cortrak feeding tube.     X-ray is required, abdominal x-ray has been ordered by the Cortrak team. Please confirm tube placement before using the Cortrak tube.   If the tube becomes dislodged please keep the tube and contact the Cortrak team at [email or web address] ([email or web address]) for replacement.  If after hours and replacement cannot be delayed, place a NG tube and confirm placement with an abdominal x-ray.   Betsey Holidayasey Ishmeal Rorie MS, RD, LDN Pager #- [phone] Office#- [phone] After Hours Pager: [phone]

## 2018-09-29 NOTE — Progress Notes (Signed)
MEDICATION RELATED CONSULT NOTE  Pharmacy Consult for Phenytoin Indication: seizures  Allergies  Allergen Reactions  . Rocephin [Ceftriaxone] Hives    Patient Measurements: Weight: 105 lb 6.1 oz (47.8 kg)  Vital Signs: Temp: 97.6 F (36.4 C) (12/23 0739) Temp Source: Axillary (12/23 0739) BP: 117/78 (12/23 0800) Pulse Rate: 99 (12/23 0800) Intake/Output from previous day: 12/22 0701 - 12/23 0700 In: 245 [IV Piggyback:245] Out: 1800 [Urine:1800] Intake/Output from this shift: No intake/output data recorded.  Labs: Recent Labs    09/27/18 0303 09/27/18 0743 09/28/18 0523 09/28/18 0733 09/29/18 0121  WBC  --  10.9*  --  7.6 8.8  HGB  --  10.9*  --  8.9* 9.3*  HCT  --  31.9*  --  27.2* 28.8*  PLT  --  351  --  289 338  CREATININE 0.53*  --  0.62  --  0.59*  MG  --   --   --  1.7 1.7  PHOS  --   --   --   --  3.2   CrCl cannot be calculated (Unknown ideal weight.).   Microbiology: Recent Results (from the past 720 hour(s))  MRSA PCR Screening     Status: None   Collection Time: 09/25/18 12:50 PM  Result Value Ref Range Status   MRSA by PCR NEGATIVE NEGATIVE Final    Comment:        The GeneXpert MRSA Assay (FDA approved for NASAL specimens only), is one component of a comprehensive MRSA colonization surveillance program. It is not intended to diagnose MRSA infection nor to guide or monitor treatment for MRSA infections. Performed at [facility], [address] [place], [place] [address]   Culture, Urine     Status: None   Collection Time: 09/26/18  2:10 PM  Result Value Ref Range Status   Specimen Description URINE, CLEAN CATCH  Final   Special Requests   Final    NONE Performed at [facility], [address] [place], [place] [address]    Culture   Final    Multiple bacterial morphotypes present, none predominant. Suggest appropriate recollection if clinically indicated.   Report Status 09/27/2018 FINAL  Final  Culture, blood  (routine x 2)     Status: None (Preliminary result)   Collection Time: 09/27/18  2:20 PM  Result Value Ref Range Status   Specimen Description BLOOD RIGHT HAND  Final   Special Requests AEROBIC BOTTLE ONLY Blood Culture adequate volume  Final   Culture   Final    NO GROWTH < 24 HOURS Performed at [facility], [address] [place], [place] [address]    Report Status PENDING  Incomplete  Culture, blood (routine x 2)     Status: None (Preliminary result)   Collection Time: 09/27/18  2:25 PM  Result Value Ref Range Status   Specimen Description BLOOD RIGHT HAND  Final   Special Requests   Final    AEROBIC BOTTLE ONLY Blood Culture results may not be optimal due to an excessive volume of blood received in culture bottles   Culture   Final    NO GROWTH < 24 HOURS Performed at [name], [address] [place], [place] [address]    Report Status PENDING  Incomplete    Medical History: Past Medical History:  Diagnosis Date  . Leigh syndrome (HCC)   . Mitochondrial Leigh syndrome   . Pressure ulcer     Medications:  Medications Prior to Admission  Medication Sig Dispense Refill Last Dose  . B Complex-Biotin-FA (B COMPLETE) TABS Take 1 tablet by mouth daily.   09/23/2018  . ferrous sulfate 325 (65 FE) MG EC tablet Take 325 mg by mouth daily with breakfast.   09/23/2018  . LevOCARNitine (L-CARNITINE) 250 MG CAPS Take 250 mg by mouth daily.   09/23/2018  . polyethylene glycol (MIRALAX / GLYCOLAX) packet Take 17 g by mouth every other day.   09/23/2018    Assessment: 27 year old male with history of cerebral palsy, mitochondrial Leigh syndrome, pressure ulcers, feeding [facility] after presenting with fevers and uncontrollable RUE movements concerning for seizures. Pt was on continuous EEG monitoring, which did not show seizures but continues with arm twitching.  Pharmacy consulted for phenytoin maintenance dosing, also continues on Keppra and  Vimpat.  SCr wnl/stable, alb 2.6 (low) at baseline, LFTs wnl.  Goal of Therapy:  Phenytoin level 10-20 mcg/ml  Plan:  Continue phenytoin 100mg  IV q8h Will f/u phenytoin level in 5-7 days (at [facility])  Babs BertinHaley Crespin Forstrom, PharmD, BCPS Clinical Pharmacist Clinical phone [phone] Please check AMION for all [facility] contact numbers 09/29/2018 11:03 AM

## 2018-09-29 NOTE — Progress Notes (Signed)
PROGRESS NOTE    Jason Heaterhaddeus R Batton Jr.  ZOX:[id number] DOB: 1991-05-06 DOA: 09/25/2018 PCP: [name], [name], MD     Brief Narrative:  Jason Heaterhaddeus R Todt Jr. is a 27 year old male with history of cerebral palsy, mitochondrial Leigh syndrome, pressure ulcers, feeding tube, was transferred from [facility] after presenting there with fevers and uncontrollable right upper extremity movements concerning for seizures.  At baseline he is able to communicate and uses a motorized wheelchair.  He participates in group activities and apparently he performs really well.  Since last Tuesday he has been nonverbal and has been having twitching movements of the right upper extremity and face.  New events last 24 hours / Subjective: Patient more alert today, remains nonverbal and noninteractive. NGT not placed yesterday, unclear why. IR informed me that they're unable to replace patient's G-tube button due to availability of supply until later this week. Cortrak placement for nutrition.    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 Avoid valproic acid.  -Seizure precautions  -Neurology following  -IV thiamine started   Fever -Patient was started empirically on acyclovir, aztreonam and vancomycin. Stop vanco.   -UA negative for infection, urine culture with multiple bacterial morphotypes present -Blood culture negative to date  -Flu negative   -Unclear source of infection   Mitochondrial Leigh syndrome -Resume levocarnitine once G tube malfunction resolved  G  tube displacement  -IR to evaluate -Cortrak for nutrition in the meanwhile   Hypokalemia -Replace, trend    DVT prophylaxis: SCDs Code Status: Full Family Communication: Mother at bedside Disposition Plan: Pending improvement   Consultants:   Neurology  Procedures:   Continuous EEG   Antimicrobials:  Anti-infectives (From admission, onward)   Start     Dose/Rate Route Frequency Ordered Stop   09/26/18 1100  vancomycin (VANCOCIN) 500 mg in sodium chloride 0.9 % 100 mL IVPB  Status:  Discontinued     500 mg 100 mL/hr over 60 Minutes Intravenous Every 8 hours 09/26/18 1015 09/28/18 1224   09/25/18 1700  vancomycin (VANCOCIN) IVPB 1000 mg/200 mL premix     1,000 mg 200 mL/hr over 60 Minutes Intravenous  Once 09/25/18 1608 09/25/18 2000   09/25/18 1700  aztreonam (AZACTAM) 2 g in sodium chloride 0.9 % 100 mL IVPB     2 g 200 mL/hr over 30 Minutes Intravenous Every 8 hours 09/25/18 1608     09/25/18 1700  acyclovir (ZOVIRAX) 450 mg in dextrose 5 % 100 mL IVPB     450 mg 109 mL/hr over 60 Minutes Intravenous Every 8 hours 09/25/18 1608         Objective: Vitals:   09/29/18 0739 09/29/18 0800 09/29/18 1146 09/29/18 1152  BP: 115/65 117/78  112/75  Pulse: 94 99  (!) 102  Resp: (!) 22 (!) 48  15  Temp: 97.6 F (36.4 C)   97.6 F (36.4 C)  TempSrc: Axillary   Axillary  SpO2: 100% 100%  100%  Weight:      Height:   5\' 9"  (1.753 m)     Intake/Output Summary (Last 24 hours)  at 09/29/2018 1208 Last data filed at 09/29/2018 1112 Gross per 24 hour  Intake 0 ml  Output 2150 ml  Net -2150 ml   Filed Weights   09/25/18 1245  Weight: 47.8 kg    Examination: General exam: Appears calm and comfortable  Respiratory system: Clear to auscultation. Respiratory effort normal. Cardiovascular system: S1 & S2 heard, tachycardic regular rhythm. No JVD, murmurs, rubs, gallops or clicks. No pedal edema. Gastrointestinal system: Abdomen is nondistended, soft and nontender. +G  tube in place  Central nervous system: Alert but noninteractive  Extremities: +Contracture of extremities      Data Reviewed: I have personally reviewed following labs and imaging studies  CBC: Recent Labs  Lab 09/25/18 1412 09/26/18 0455 09/27/18 0743 09/28/18 0733 09/29/18 0121  WBC 9.9 8.8 10.9* 7.6 8.8  NEUTROABS 7.4 5.8 7.4  --   --   HGB 11.4* 11.2* 10.9* 8.9* 9.3*  HCT 34.3* 34.7* 31.9* 27.2* 28.8*  MCV 85.8 88.3 82.6 88.0 86.2  PLT 345 324 351 289 338   Basic Metabolic Panel: Recent Labs  Lab 09/25/18 1412 09/26/18 0455 09/27/18 0303 09/28/18 0523 09/28/18 0733 09/29/18 0121  NA 138 139 138 140  --  142  K 3.6 3.6 3.6 3.1*  --  3.4*  CL 109 111 109 114*  --  113*  CO2 17* 16* 18* 17*  --  18*  GLUCOSE 95 98 100* 100*  --  106*  BUN 8 6 <5* <5*  --  <5*  CREATININE 0.61 0.78 0.53* 0.62  --  0.59*  CALCIUM 9.0 9.0 8.7* 8.5*  --  8.5*  MG 1.8  --   --   --  1.7 1.7  PHOS 3.5  --   --   --   --  3.2   GFR: Estimated Creatinine Clearance: 93.8 mL/min (A) (by C-G formula based on SCr of 0.59 mg/dL (L)). Liver Function Tests: Recent Labs  Lab 09/25/18 1412 09/26/18 0455  AST 18 19  ALT 24 22  ALKPHOS 90 75  BILITOT 0.8 0.6  PROT 6.5 6.1*  ALBUMIN 2.9* 2.6*   No results for input(s): LIPASE, AMYLASE in the last 168 hours. No results for input(s): AMMONIA in the last 168 hours. Coagulation Profile: Recent Labs  Lab 09/25/18 1412  INR 1.16   Cardiac Enzymes: No results for input(s): CKTOTAL, CKMB, CKMBINDEX, TROPONINI in the last 168 hours. BNP (last 3 results) No results for input(s): PROBNP in the last 8760 hours. HbA1C: No results for input(s): HGBA1C in the last 72 hours. CBG: No results for input(s): GLUCAP in the last 168 hours. Lipid Profile: No results for input(s): CHOL, HDL, LDLCALC, TRIG, CHOLHDL, LDLDIRECT in the last 72 hours. Thyroid Function Tests: No results for input(s): TSH, T4TOTAL, FREET4, T3FREE, THYROIDAB in the last 72  hours. Anemia Panel: No results for input(s): VITAMINB12, FOLATE, FERRITIN, TIBC, IRON, RETICCTPCT in the last 72 hours. Sepsis Labs: No results for input(s): PROCALCITON, LATICACIDVEN in the last 168 hours.  Recent Results (from the past 240 hour(s))  MRSA PCR Screening     Status: None   Collection Time: 09/25/18 12:50 PM  Result Value Ref Range Status   MRSA by PCR NEGATIVE NEGATIVE Final    Comment:        The GeneXpert MRSA Assay (FDA approved for NASAL specimens only), is one component of a comprehensive MRSA colonization surveillance program. It is not intended to diagnose MRSA infection nor to guide or monitor treatment for  MRSA infections. Performed at [facility], [address] [place], [place] [address]   Culture, Urine     Status: None   Collection Time: 09/26/18  2:10 PM  Result Value Ref Range Status   Specimen Description URINE, CLEAN CATCH  Final   Special Requests   Final    NONE Performed at [facility], [address] [place], [place] [address]    Culture   Final    Multiple bacterial morphotypes present, none predominant. Suggest appropriate recollection if clinically indicated.   Report Status 09/27/2018 FINAL  Final  Culture, blood (routine x 2)     Status: None (Preliminary result)   Collection Time: 09/27/18  2:20 PM  Result Value Ref Range Status   Specimen Description BLOOD RIGHT HAND  Final   Special Requests AEROBIC BOTTLE ONLY Blood Culture adequate volume  Final   Culture   Final    NO GROWTH 2 DAYS Performed at [facility], [address] [place], [place] [address]    Report Status PENDING  Incomplete  Culture, blood (routine x 2)     Status: None (Preliminary result)   Collection Time: 09/27/18  2:25 PM  Result Value Ref Range Status   Specimen Description BLOOD RIGHT HAND  Final   Special Requests   Final    AEROBIC BOTTLE ONLY Blood Culture results may not be optimal due to an excessive volume of blood  received in culture bottles   Culture   Final    NO GROWTH 2 DAYS Performed at [facility], [name] [place], [place] [address]    Report Status PENDING  Incomplete       Radiology Studies: No results found.    Scheduled Meds: . phenytoin (DILANTIN) IV  100 mg Intravenous Q8H  . sodium chloride flush  10-40 mL Intracatheter Q12H   Continuous Infusions: . potassium chloride    . acyclovir 450 mg (09/29/18 0505)  . aztreonam 2 g (09/29/18 0415)  . dextrose 5 % and 0.9% NaCl 50 mL/hr at 09/28/18 0640  . lacosamide (VIMPAT) IV 200 mg (09/29/18 0506)  . levETIRAcetam 1,500 mg (09/29/18 0413)  . thiamine injection 500 mg (09/29/18 1005)     LOS: 4 days    Time spent: 25 minutes   Noralee StainJennifer Kessler Solly, DO [facility] [email or web address] Password [email or web address] 09/29/2018, 12:08 PM

## 2018-09-29 NOTE — Progress Notes (Signed)
Pharmacy Antibiotic Note  Jason Heaterhaddeus R Barbeau Jr. is a 27 y.o. male admitted on 09/25/2018 with FUO. Pharmacy has been consulted for Aztreonam / Acyclovir dosing - day #4. All of the cultures are neg. Aztreonam may not be the best choice due to its limited coverage. We don't have anything to help guide us with the acyclovir. D/w with Dr Alvino Chapelhoi, we will consolidate aztreonam/[facility]clovir [facility] complete 7 total days of abx. .  Plan: Dc acyclovir/aztreonam Merrem 2g IV q8 x 4 more days  Height: 5\' 9"  (175.3 cm) Weight: 105 lb 6.1 oz (47.8 kg) IBW/kg (Calculated) : 70.7  Temp (24hrs), Avg:98.8 F (37.1 C), Min:97.6 F (36.4 C), Max:100.5 F (38.1 C)  Recent Labs  Lab 09/25/18 1412 09/26/18 0455 09/27/18 0303 09/27/18 0743 09/28/18 0523 09/28/18 0733 09/28/18 1049 09/29/18 0121  WBC 9.9 8.8  --  10.9*  --  7.6  --  8.8  CREATININE 0.61 0.78 0.53*  --  0.62  --   --  0.59*  VANCOTROUGH  --   --   --   --   --   --  17  --     Estimated Creatinine Clearance: 93.8 mL/min (A) (by C-G formula based on SCr of 0.59 mg/dL (L)).    Allergies  Allergen Reactions  . Rocephin [Ceftriaxone] Hives   Ulyses SouthwardMinh Pham, PharmD, ChisholmBCIDP, AAHIVP, CPP Infectious Disease Pharmacist 09/29/2018 1:06 PM

## 2018-09-30 ENCOUNTER — Other Ambulatory Visit: Payer: Self-pay

## 2018-09-30 LAB — BASIC METABOLIC PANEL
Anion gap: 12 (ref 5–15)
BUN: 7 mg/dL (ref 6–20)
CO2: 16 mmol/L — ABNORMAL LOW (ref 22–32)
CREATININE: 0.52 mg/dL — AB (ref 0.61–1.24)
Calcium: 8.7 mg/dL — ABNORMAL LOW (ref 8.9–10.3)
Chloride: 111 mmol/L (ref 98–111)
GFR calc Af Amer: >60 mL/min (ref >60)
GFR calc non Af Amer: >60 mL/min (ref >60)
Glucose, Bld: 120 mg/dL — ABNORMAL HIGH (ref 70–99)
Potassium: 4.3 mmol/L (ref 3.5–5.1)
Sodium: 139 mmol/L (ref 135–145)

## 2018-09-30 LAB — CBC
HCT: 31.4 % — ABNORMAL LOW (ref 39.0–52.0)
Hemoglobin: 10.9 g/dL — ABNORMAL LOW (ref 13.0–17.0)
MCH: 29.5 pg (ref 26.0–34.0)
MCHC: 34.7 g/dL (ref 30.0–36.0)
MCV: 85.1 fL (ref 80.0–100.0)
Platelets: 252 10*3/uL (ref 150–400)
RBC: 3.69 MIL/uL — ABNORMAL LOW (ref 4.22–5.81)
RDW: 14.4 % (ref 11.5–15.5)
WBC: 9.7 10*3/uL (ref 4.0–10.5)
nRBC: 0.2 % (ref 0.0–0.2)

## 2018-09-30 LAB — PHOSPHORUS: Phosphorus: 3.3 mg/dL (ref 2.5–4.6)

## 2018-09-30 LAB — MAGNESIUM: Magnesium: 1.8 mg/dL (ref 1.7–2.4)

## 2018-09-30 MED ORDER — JEVITY 1.2 CAL PO LIQD
1000.0000 mL | ORAL | Status: DC
  Administered 2018-09-30 – 2018-10-08 (×12): 1000 mL
  Filled 2018-09-30 (×19): qty 1000

## 2018-09-30 MED ORDER — LEVOCARNITINE 1 GM/10ML PO SOLN
250.0000 mg | Freq: Every day | ORAL | Status: DC
  Administered 2018-09-30 – 2018-10-17 (×18): 250 mg
  Filled 2018-09-30 (×19): qty 2.5

## 2018-09-30 NOTE — Consult Note (Signed)
Consult for Midline not drawing blood back, informed RN that this does not always work for lab draws and will be a lab stick

## 2018-09-30 NOTE — Progress Notes (Signed)
Neurology Progress Note   S:// No acute events but continues to have some right arm jerking. Was transferred from outside hospital for possible sepsis.  Unclear etiology of sepsis Continues to have some metabolic derangements Has a history of Leigh syndrome, mitochondrial disorder.   O:// Current vital signs: BP 122/72 (BP Location: Left Arm)   Pulse (!) 111   Temp 97.9 F (36.6 C) (Oral)   Resp (!) 22   Ht 5\' 9"  (1.753 m)   Wt 47.8 kg   SpO2 100%   BMI 15.56 kg/m  Vital signs in last 24 hours: Temp:  [97.6 F (36.4 C)-99.6 F (37.6 C)] 97.9 F (36.6 C) (12/24 0734) Pulse Rate:  [99-125] 111 (12/24 0734) Resp:  [12-22] 22 (12/24 0734) BP: (111-129)/(72-85) 122/72 (12/24 0734) SpO2:  [99 %-100 %] 100 % (12/24 0734) Neurological exam Alert, awake.  Does not follow commands Cranials: Pupils equal round react to light, extraocular movements intact, no forced gaze deviation or gaze preference, symmetric facies. Motor exam: Increased tone in all 4 extremities with intermittent right upper extremity rhythmic jerking. Difficult to assess coordination Grimaces to noxious stimulation in all fours  Medications  Current Facility-Administered Medications:  .  acetaminophen (TYLENOL) solution 650 mg, 650 mg, Per Tube, Q6H PRN, Schorr, Roma KayserKatherine P, NP, 650 mg at 09/29/18 2311 .  acetaminophen (TYLENOL) suppository 650 mg, 650 mg, Rectal, Q6H PRN, Noralee StainChoi, Jennifer, DO, 650 mg at 09/28/18 2031 .  dextrose 5 %-0.9 % sodium chloride infusion, , Intravenous, Continuous, Bobette Mortiz, David Manuel, MD, Last Rate: 50 mL/hr at 09/29/18 2300 .  feeding supplement (JEVITY 1.2 CAL) liquid 1,000 mL, 1,000 mL, Per Tube, Continuous, Noralee Stainhoi, Jennifer, DO .  free water 30 mL, 30 mL, Per Tube, Q4H, Noralee Stainhoi, Jennifer, DO, 30 mL at 09/30/18 0449 .  lacosamide (VIMPAT) 200 mg in sodium chloride 0.9 % 25 mL IVPB, 200 mg, Intravenous, Q12H, Ulice DashSmith, David R, PA-C, Last Rate: 90 mL/hr at 09/30/18 0452, 200 mg at 09/30/18  0452 .  levETIRAcetam (KEPPRA) IVPB 1500 mg/ 100 mL premix, 1,500 mg, Intravenous, Q12H, Aroor, Dara LordsSushanth R, MD, Last Rate: 400 mL/hr at 09/30/18 0456, 1,500 mg at 09/30/18 0456 .  LORazepam (ATIVAN) injection 0.5 mg, 0.5 mg, Intravenous, Q4H PRN, Bobette Mortiz, David Manuel, MD, 0.5 mg at 09/30/18 0015 .  meropenem (MERREM) 1 g in sodium chloride 0.9 % 100 mL IVPB, 1 g, Intravenous, Q8H, Noralee Stainhoi, Jennifer, DO, Last Rate: 200 mL/hr at 09/30/18 0645, 1 g at 09/30/18 0645 .  nutrition supplement (JUVEN) (JUVEN) powder packet 1 packet, 1 packet, Per Tube, BID BM, Noralee Stainhoi, Jennifer, DO .  phenytoin (DILANTIN) injection 100 mg, 100 mg, Intravenous, Q8H, Titus Mouldmend, Caron G, RPH, 100 mg at 09/30/18 0645 .  sodium chloride flush (NS) 0.9 % injection 10-40 mL, 10-40 mL, Intracatheter, Q12H, Noralee Stainhoi, Jennifer, DO, 10 mL at 09/29/18 2210 .  sodium chloride flush (NS) 0.9 % injection 10-40 mL, 10-40 mL, Intracatheter, PRN, Noralee Stainhoi, Jennifer, DO .  thiamine 500mg  in normal saline (50ml) IVPB, 500 mg, Intravenous, TID, Aroor, Georgiana SpinnerSushanth R, MD, Last Rate: 100 mL/hr at 09/29/18 2207, 500 mg at 09/29/18 2207 Labs CBC    Component Value Date/Time   WBC 9.7 09/30/2018 0635   RBC 3.69 (L) 09/30/2018 0635   HGB 10.9 (L) 09/30/2018 0635   HCT 31.4 (L) 09/30/2018 0635   PLT 252 09/30/2018 0635   MCV 85.1 09/30/2018 0635   MCH 29.5 09/30/2018 0635   MCHC 34.7 09/30/2018 0635   RDW 14.4 09/30/2018 [id number]  LYMPHSABS 2.1 09/27/2018 0743   MONOABS 1.0 09/27/2018 0743   EOSABS 0.2 09/27/2018 0743   BASOSABS 0.1 09/27/2018 0743    CMP     Component Value Date/Time   NA 142 09/29/2018 0121   K 3.4 (L) 09/29/2018 0121   CL 113 (H) 09/29/2018 0121   CO2 18 (L) 09/29/2018 0121   GLUCOSE 106 (H) 09/29/2018 0121   BUN <5 (L) 09/29/2018 0121   CREATININE 0.59 (L) 09/29/2018 0121   CALCIUM 8.5 (L) 09/29/2018 0121   PROT 6.1 (L) 09/26/2018 0455   ALBUMIN 2.6 (L) 09/26/2018 0455   AST 19 09/26/2018 0455   ALT 22 09/26/2018 0455   ALKPHOS 75  09/26/2018 0455   BILITOT 0.6 09/26/2018 0455   GFRNONAA >60 09/29/2018 0121   GFRAA >60 09/29/2018 0121   No new imaging to review MRI brain done a few days ago consistent with Nedra HaiLee syndrome  Assessment:  27 year old man past medical history of LEIGH syndrome , Seizure disorder, transferred for fever and uncontrollable right upper extremity movements after a misplaced PEG tube and has since been nonverbal. He is no longer febrile, received treatment with antibiotics with urine culture showing multiple bacteria and unclear source of infection. He has been treated with 3 antiepileptics with some improvement in the right facial twitching but continues to have some right upper extremity intermittent twitching. Long-term EEG was obtained that showed no electro graphic abnormality with the ongoing clinical activity. Still could represent focal status epilepticus or prolonged postictal phase.  Impression: Etiology of syndrome Questionable focal status epilepticus Metabolic encephalopathy   Recommendations: Continue thiamine supplementation Continue Dilantin Vimpat Keppra Has been started on tube feeds.  According to mother whenever he has discontinued tube feeds, he does become encephalopathic and has had breakthrough seizures. Resume home dose of levocarnitine - ordered We would hope that resuming his medications as well as tube feeds will improve his mentation. He is a patient of Dr. Sharene SkeansHickling, and because of the [place] of [place] has lost follow-up and has no neurologist for now.  We would recommend following with Dr. Sharene SkeansHickling on discharge.    -- Milon DikesAshish Junelle Hashemi, MD [facility] Pager: [phone] If 7pm to 7am, please call on call as listed on AMION.

## 2018-09-30 NOTE — Progress Notes (Signed)
[facility] Did not do fulll assessment but I dont think he charted his. Patient was asleep and mother did not want me to wake him up.

## 2018-09-30 NOTE — Progress Notes (Signed)
PROGRESS NOTE    Jason Heaterhaddeus R Swartzendruber Jr.  BJY:[id number] DOB: September 25, 1991 DOA: 09/25/2018 PCP: [name], [name], MD     Brief Narrative:  Jason Heaterhaddeus R Scheffler Jr. is a 27 year old male with history of cerebral palsy, mitochondrial Leigh syndrome, pressure ulcers, feeding tube, was transferred from [facility] after presenting there with fevers and uncontrollable right upper extremity movements concerning for seizures.  At baseline he is able to communicate and uses a motorized wheelchair.  He participates in group activities and apparently he performs really well.  Since last Tuesday he has been nonverbal and has been having twitching movements of the right upper extremity and face.  New events last 24 hours / Subjective: Cortrak placed yesterday and now receiving tube feeds. Patient alert today although non interactive.   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 Avoid valproic acid.  -Resume levocarnitine  -Seizure precautions  -Neurology following. Patient needs follow up with Dr. Sharene SkeansHickling  -IV thiamine started   Fever -Patient was started empirically on acyclovir, aztreonam and vancomycin --> merrem   -UA negative for infection, urine culture with multiple bacterial morphotypes present -Blood culture negative to date  -Flu negative   -Unclear source of infection. WBC normal and now afebrile.   Mitochondrial Leigh syndrome -Resume levocarnitine once G tube malfunction resolved  G tube displacement  -IR to evaluate -Cortrak for nutrition in  the meanwhile    DVT prophylaxis: SCDs Code Status: Full Family Communication: Mother at bedside Disposition Plan: Pending improvement, will need G tube evaluated and replaced once supply in stock in few days per IR    Consultants:   Neurology  IR   Procedures:   Continuous EEG   Antimicrobials:  Anti-infectives (From admission, onward)   Start     Dose/Rate Route Frequency Ordered Stop   09/29/18 1400  meropenem (MERREM) 1 g in sodium chloride 0.9 % 100 mL IVPB     1 g 200 mL/hr over 30 Minutes Intravenous Every 8 hours 09/29/18 1306 10/02/18 2359   09/26/18 1100  vancomycin (VANCOCIN) 500 mg in sodium chloride 0.9 % 100 mL IVPB  Status:  Discontinued     500 mg 100 mL/hr over 60 Minutes Intravenous Every 8 hours 09/26/18 1015 09/28/18 1224   09/25/18 1700  vancomycin (VANCOCIN) IVPB 1000 mg/200 mL premix     1,000 mg 200 mL/hr over 60 Minutes Intravenous  Once 09/25/18 1608 09/25/18 2000   09/25/18 1700  aztreonam (AZACTAM) 2 g in sodium chloride 0.9 % 100 mL IVPB  Status:  Discontinued     2 g 200 mL/hr over 30 Minutes Intravenous Every 8 hours 09/25/18 1608 09/29/18 1301   09/25/18 1700  acyclovir (ZOVIRAX) 450 mg in dextrose 5 % 100 mL IVPB  Status:  Discontinued     450 mg 109 mL/hr over 60 Minutes Intravenous Every 8 hours 09/25/18 1608 09/29/18 1301       Objective: Vitals:   09/29/18 2200 09/30/18 0000 09/30/18 0700 09/30/18 0734  BP:  111/72  122/72  Pulse: (!) 125 (!) 121 (!) 102 (!) 111  Resp: 18  12 16 (!) 22  Temp: 99.6 F (37.6 C) 98 F (36.7 C) 97.7 F (36.5 C) 97.9 F (36.6 C)  TempSrc: Axillary Axillary Oral Oral  SpO2: 100% 99% 100% 100%  Weight:      Height:        Intake/Output Summary (Last 24 hours) at 09/30/2018 1118 Last data filed at 09/30/2018 0800 Gross per 24 hour  Intake 3320.61 ml  Output 350 ml  Net 2970.61 ml   Filed Weights   09/25/18 1245  Weight: 47.8 kg    Examination: General exam: Appears calm and comfortable   Respiratory system: Clear to auscultation. Respiratory effort normal. Cardiovascular system: S1 & S2 heard, tachycardic regular rhythm. No JVD, murmurs, rubs, gallops or clicks. No pedal edema. Gastrointestinal system: Abdomen is nondistended, soft +cortrak in place  Central nervous system: Alert, noninteractive  Extremities: +contractures      Data Reviewed: I have personally reviewed following labs and imaging studies  CBC: Recent Labs  Lab 09/25/18 1412 09/26/18 0455 09/27/18 0743 09/28/18 0733 09/29/18 0121 09/30/18 0635  WBC 9.9 8.8 10.9* 7.6 8.8 9.7  NEUTROABS 7.4 5.8 7.4  --   --   --   HGB 11.4* 11.2* 10.9* 8.9* 9.3* 10.9*  HCT 34.3* 34.7* 31.9* 27.2* 28.8* 31.4*  MCV 85.8 88.3 82.6 88.0 86.2 85.1  PLT 345 324 351 289 338 252   Basic Metabolic Panel: Recent Labs  Lab 09/25/18 1412 09/26/18 0455 09/27/18 0303 09/28/18 0523 09/28/18 0733 09/29/18 0121 09/30/18 0905  NA 138 139 138 140  --  142 139  K 3.6 3.6 3.6 3.1*  --  3.4* 4.3  CL 109 111 109 114*  --  113* 111  CO2 17* 16* 18* 17*  --  18* 16*  GLUCOSE 95 98 100* 100*  --  106* 120*  BUN 8 6 <5* <5*  --  <5* 7  CREATININE 0.61 0.78 0.53* 0.62  --  0.59* 0.52*  CALCIUM 9.0 9.0 8.7* 8.5*  --  8.5* 8.7*  MG 1.8  --   --   --  1.7 1.7 1.8  PHOS 3.5  --   --   --   --  3.2 3.3   GFR: Estimated Creatinine Clearance: 92.9 mL/min (A) (by C-G formula based on SCr of 0.52 mg/dL (L)). Liver Function Tests: Recent Labs  Lab 09/25/18 1412 09/26/18 0455  AST 18 19  ALT 24 22  ALKPHOS 90 75  BILITOT 0.8 0.6  PROT 6.5 6.1*  ALBUMIN 2.9* 2.6*   No results for input(s): LIPASE, AMYLASE in the last 168 hours. No results for input(s): AMMONIA in the last 168 hours. Coagulation Profile: Recent Labs  Lab 09/25/18 1412  INR 1.16   Cardiac Enzymes: No results for input(s): CKTOTAL, CKMB, CKMBINDEX, TROPONINI in the last 168 hours. BNP (last 3 results) No results for input(s): PROBNP in the last 8760  hours. HbA1C: No results for input(s): HGBA1C in the last 72 hours. CBG: No results for input(s): GLUCAP in the last 168 hours. Lipid Profile: No results for input(s): CHOL, HDL, LDLCALC, TRIG, CHOLHDL, LDLDIRECT in the last 72 hours. Thyroid Function Tests: No results for input(s): TSH, T4TOTAL, FREET4, T3FREE, THYROIDAB in the last 72 hours. Anemia Panel: No results for input(s): VITAMINB12, FOLATE, FERRITIN, TIBC, IRON, RETICCTPCT in the last 72 hours. Sepsis Labs: No results for input(s): PROCALCITON, LATICACIDVEN in the last 168 hours.  Recent Results (from the past 240 hour(s))  MRSA PCR Screening  Status: None   Collection Time: 09/25/18 12:50 PM  Result Value Ref Range Status   MRSA by PCR NEGATIVE NEGATIVE Final    Comment:        The GeneXpert MRSA Assay (FDA approved for NASAL specimens only), is one component of a comprehensive MRSA colonization surveillance program. It is not intended to diagnose MRSA infection nor to guide or monitor treatment for MRSA infections. Performed at [facility], [address] [place], [place] [address]   Culture, Urine     Status: None   Collection Time: 09/26/18  2:10 PM  Result Value Ref Range Status   Specimen Description URINE, CLEAN CATCH  Final   Special Requests   Final    NONE Performed at [facility], [address] [place], [place] [address]    Culture   Final    Multiple bacterial morphotypes present, none predominant. Suggest appropriate recollection if clinically indicated.   Report Status 09/27/2018 FINAL  Final  Culture, blood (routine x 2)     Status: None (Preliminary result)   Collection Time: 09/27/18  2:20 PM  Result Value Ref Range Status   Specimen Description BLOOD RIGHT HAND  Final   Special Requests AEROBIC BOTTLE ONLY Blood Culture adequate volume  Final   Culture   Final    NO GROWTH 3 DAYS Performed at [facility], [address] [place], [place] [address]    Report  Status PENDING  Incomplete  Culture, blood (routine x 2)     Status: None (Preliminary result)   Collection Time: 09/27/18  2:25 PM  Result Value Ref Range Status   Specimen Description BLOOD RIGHT HAND  Final   Special Requests   Final    AEROBIC BOTTLE ONLY Blood Culture results may not be optimal due to an excessive volume of blood received in culture bottles   Culture   Final    NO GROWTH 3 DAYS Performed at [facility], [address] [place], [place] [address]    Report Status PENDING  Incomplete       Radiology Studies: Dg Abd Portable 1v  Result Date: 09/29/2018 CLINICAL DATA:  Feeding tube placement EXAM: PORTABLE ABDOMEN - 1 VIEW COMPARISON:  None. FINDINGS: The tip of the weighted enteric tube projects in the expected location of the gastroduodenal junction. No dilated small bowel. There is severe left convex scoliosis. Visualized lung bases are clear. IMPRESSION: Tip of the weighted enteric tube projects in the expected location of the gastroduodenal junction. Electronically Signed   By: Deatra RobinsonKevin  Herman M.D.   On: 09/29/2018 16:58      Scheduled Meds: . free water  30 mL Per Tube Q4H  . levOCARNitine  250 mg Per Tube Daily  . nutrition supplement (JUVEN)  1 packet Per Tube BID BM  . phenytoin (DILANTIN) IV  100 mg Intravenous Q8H  . sodium chloride flush  10-40 mL Intracatheter Q12H   Continuous Infusions: . dextrose 5 % and 0.9% NaCl 50 mL/hr at 09/29/18 2300  . feeding supplement (JEVITY 1.2 CAL) 1,000 mL (09/30/18 0935)  . lacosamide (VIMPAT) IV 200 mg (09/30/18 0452)  . levETIRAcetam 1,500 mg (09/30/18 0456)  . meropenem (MERREM) IV 1 g (09/30/18 0645)  . thiamine injection 500 mg (09/30/18 0936)     LOS: 5 days    Time spent: 25 minutes   Noralee StainJennifer Kee Drudge, DO [facility] [email or web address] Password [email or web address] 09/30/2018, 11:18 AM

## 2018-10-01 LAB — PHOSPHORUS: Phosphorus: 2.1 mg/dL — ABNORMAL LOW (ref 2.5–4.6)

## 2018-10-01 LAB — BASIC METABOLIC PANEL
ANION GAP: 10 (ref 5–15)
BUN: 12 mg/dL (ref 6–20)
CO2: 20 mmol/L — ABNORMAL LOW (ref 22–32)
Calcium: 8.3 mg/dL — ABNORMAL LOW (ref 8.9–10.3)
Chloride: 110 mmol/L (ref 98–111)
Creatinine, Ser: 0.56 mg/dL — ABNORMAL LOW (ref 0.61–1.24)
GFR calc Af Amer: >60 mL/min (ref >60)
Glucose, Bld: 112 mg/dL — ABNORMAL HIGH (ref 70–99)
Potassium: 3.6 mmol/L (ref 3.5–5.1)
Sodium: 140 mmol/L (ref 135–145)

## 2018-10-01 LAB — CBC
HCT: 34 % — ABNORMAL LOW (ref 39.0–52.0)
Hemoglobin: 10.9 g/dL — ABNORMAL LOW (ref 13.0–17.0)
MCH: 27.7 pg (ref 26.0–34.0)
MCHC: 32.1 g/dL (ref 30.0–36.0)
MCV: 86.5 fL (ref 80.0–100.0)
Platelets: 411 10*3/uL — ABNORMAL HIGH (ref 150–400)
RBC: 3.93 MIL/uL — ABNORMAL LOW (ref 4.22–5.81)
RDW: 14.6 % (ref 11.5–15.5)
WBC: 9.9 10*3/uL (ref 4.0–10.5)
nRBC: 0 % (ref 0.0–0.2)

## 2018-10-01 LAB — MAGNESIUM: MAGNESIUM: 1.8 mg/dL (ref 1.7–2.4)

## 2018-10-01 MED ORDER — POTASSIUM CHLORIDE 20 MEQ/15ML (10%) PO SOLN
40.0000 meq | Freq: Once | ORAL | Status: AC
  Administered 2018-10-01: 40 meq
  Filled 2018-10-01: qty 30

## 2018-10-01 MED ORDER — LOPERAMIDE HCL 1 MG/7.5ML PO SUSP
2.0000 mg | ORAL | Status: DC | PRN
  Filled 2018-10-01: qty 15

## 2018-10-01 MED ORDER — MAGNESIUM SULFATE 2 GM/50ML IV SOLN
2.0000 g | Freq: Once | INTRAVENOUS | Status: AC
  Administered 2018-10-01: 2 g via INTRAVENOUS
  Filled 2018-10-01: qty 50

## 2018-10-01 NOTE — Progress Notes (Signed)
Brief no-charge note  Seen and examined this AM. Mother reports he is more responsive and has had small chats with her. He was able to close his eyes and move the right arm some to command for me.  Recs: C/w with current management and f/u with Dr. Sharene SkeansHickling on d/c  Neurology will be available as needed.  -- Milon DikesAshish Dakayla Disanti, MD [facility] Pager: [phone] If 7pm to 7am, please call on call as listed on AMION.

## 2018-10-01 NOTE — Progress Notes (Signed)
Chaplain responded to spiritual consult.  Met mother "[name]" bedside. She was very positive about the care " Jason Glover" has been receiving and said he was doing better today.  Complimented staff and said "I believe in the Power."  Chaplain offered ministry of presence and prayer.  Will be available for follow-up.  Tamsen Snider Pager [phone]

## 2018-10-01 NOTE — Progress Notes (Signed)
PROGRESS NOTE    Jason Heaterhaddeus R Blankenship Jr.  ZOX:[id number] DOB: 07-04-1991 DOA: 09/25/2018 PCP: [name], [name], MD     Brief Narrative:  Jason Heaterhaddeus R Iacobucci Jr. is a 27 year old male with history of cerebral palsy, mitochondrial Leigh syndrome, pressure ulcers, feeding tube, was transferred from [facility] after presenting there with fevers and uncontrollable right upper extremity movements concerning for seizures.  At baseline he is able to communicate and uses a motorized wheelchair.  He participates in group activities and apparently he performs really well.  Since last Tuesday he has been nonverbal and has been having twitching movements of the right upper extremity and face.  New events last 24 hours / Subjective: Mother at bedside states he has been much better since tube feeding was started. More alert on exam today. Since TF was started, he has had 4 loose bowel movements.   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 Avoid valproic acid.  -Resume levocarnitine  -Seizure precautions  -Neurology following. Patient needs follow up with Dr. Sharene SkeansHickling  -IV thiamine started    Fever -Patient was started empirically on acyclovir, aztreonam and vancomycin --> merrem   -UA negative for infection, urine culture with multiple bacterial morphotypes present -Blood culture negative to date  -Flu negative   -Unclear source of infection found   Mitochondrial Leigh syndrome -Resume levocarnitine   G tube displacement  -IR to evaluate -Cortrak for  nutrition in the meanwhile    DVT prophylaxis: SCDs Code Status: Full Family Communication: Mother at bedside Disposition Plan: Pending improvement, will need G tube evaluated and replaced once supply in stock in few days per IR    Consultants:   Neurology  IR   Procedures:   Continuous EEG   Antimicrobials:  Anti-infectives (From admission, onward)   Start     Dose/Rate Route Frequency Ordered Stop   09/29/18 1400  meropenem (MERREM) 1 g in sodium chloride 0.9 % 100 mL IVPB     1 g 200 mL/hr over 30 Minutes Intravenous Every 8 hours 09/29/18 1306 10/02/18 2359   09/26/18 1100  vancomycin (VANCOCIN) 500 mg in sodium chloride 0.9 % 100 mL IVPB  Status:  Discontinued     500 mg 100 mL/hr over 60 Minutes Intravenous Every 8 hours 09/26/18 1015 09/28/18 1224   09/25/18 1700  vancomycin (VANCOCIN) IVPB 1000 mg/200 mL premix     1,000 mg 200 mL/hr over 60 Minutes Intravenous  Once 09/25/18 1608 09/25/18 2000   09/25/18 1700  aztreonam (AZACTAM) 2 g in sodium chloride 0.9 % 100 mL IVPB  Status:  Discontinued     2 g 200 mL/hr over 30 Minutes Intravenous Every 8 hours 09/25/18 1608 09/29/18 1301   09/25/18 1700  acyclovir (ZOVIRAX) 450 mg in dextrose 5 % 100 mL IVPB  Status:  Discontinued     450 mg 109 mL/hr over 60 Minutes Intravenous Every 8 hours 09/25/18 1608 09/29/18 1301       Objective: Vitals:   09/30/18 1900 09/30/18 2314 10/01/18 0435 10/01/18 0700  BP: 131/81 130/82 128/82 132/86  Pulse: (!) 116 (!)  123 (!) 110 (!) 113  Resp: 18 (!) 21 15 16   Temp: 99.3 F (37.4 C) (!) 100.4 F (38 C) 99.3 F (37.4 C) 98.3 F (36.8 C)  TempSrc: Axillary Oral Oral Oral  SpO2: 100% 99% 100% 100%  Weight:      Height:        Intake/Output Summary (Last 24 hours) at 10/01/2018 1209 Last data filed at 10/01/2018 0929 Gross per 24 hour  Intake 2561.09 ml  Output 925 ml  Net 1636.09 ml   Filed Weights   09/25/18 1245  Weight: 47.8 kg    Examination: General exam:  Appears calm and comfortable  Respiratory system: Clear to auscultation. Respiratory effort normal. Cardiovascular system: S1 & S2 heard, tachycardic, regular rhythm. No JVD, murmurs, rubs, gallops or clicks. No pedal edema. Gastrointestinal system: Abdomen is nondistended, soft, +cortrak  Central nervous system: Alert, nonverbal . Extremities: Contractures      Data Reviewed: I have personally reviewed following labs and imaging studies  CBC: Recent Labs  Lab 09/25/18 1412 09/26/18 0455 09/27/18 0743 09/28/18 0733 09/29/18 0121 09/30/18 0635 10/01/18 0612  WBC 9.9 8.8 10.9* 7.6 8.8 9.7 9.9  NEUTROABS 7.4 5.8 7.4  --   --   --   --   HGB 11.4* 11.2* 10.9* 8.9* 9.3* 10.9* 10.9*  HCT 34.3* 34.7* 31.9* 27.2* 28.8* 31.4* 34.0*  MCV 85.8 88.3 82.6 88.0 86.2 85.1 86.5  PLT 345 324 351 289 338 252 411*   Basic Metabolic Panel: Recent Labs  Lab 09/25/18 1412  09/27/18 0303 09/28/18 0523 09/28/18 0733 09/29/18 0121 09/30/18 0905 10/01/18 0612  NA 138   < > 138 140  --  142 139 140  K 3.6   < > 3.6 3.1*  --  3.4* 4.3 3.6  CL 109   < > 109 114*  --  113* 111 110  CO2 17*   < > 18* 17*  --  18* 16* 20*  GLUCOSE 95   < > 100* 100*  --  106* 120* 112*  BUN 8   < > <5* <5*  --  <5* 7 12  CREATININE 0.61   < > 0.53* 0.62  --  0.59* 0.52* 0.56*  CALCIUM 9.0   < > 8.7* 8.5*  --  8.5* 8.7* 8.3*  MG 1.8  --   --   --  1.7 1.7 1.8 1.8  PHOS 3.5  --   --   --   --  3.2 3.3 2.1*   < > = values in this interval not displayed.   GFR: Estimated Creatinine Clearance: 92.9 mL/min (A) (by C-G formula based on SCr of 0.56 mg/dL (L)). Liver Function Tests: Recent Labs  Lab 09/25/18 1412 09/26/18 0455  AST 18 19  ALT 24 22  ALKPHOS 90 75  BILITOT 0.8 0.6  PROT 6.5 6.1*  ALBUMIN 2.9* 2.6*   No results for input(s): LIPASE, AMYLASE in the last 168 hours. No results for input(s): AMMONIA in the last 168 hours. Coagulation Profile: Recent Labs  Lab 09/25/18 1412  INR 1.16    Cardiac Enzymes: No results for input(s): CKTOTAL, CKMB, CKMBINDEX, TROPONINI in the last 168 hours. BNP (last 3 results) No results for input(s): PROBNP in the last 8760 hours. HbA1C: No results for input(s): HGBA1C in the last 72 hours. CBG: No results for input(s): GLUCAP in the last 168 hours. Lipid Profile: No results for input(s): CHOL, HDL, LDLCALC, TRIG, CHOLHDL, LDLDIRECT in the last 72  hours. Thyroid Function Tests: No results for input(s): TSH, T4TOTAL, FREET4, T3FREE, THYROIDAB in the last 72 hours. Anemia Panel: No results for input(s): VITAMINB12, FOLATE, FERRITIN, TIBC, IRON, RETICCTPCT in the last 72 hours. Sepsis Labs: No results for input(s): PROCALCITON, LATICACIDVEN in the last 168 hours.  Recent Results (from the past 240 hour(s))  MRSA PCR Screening     Status: None   Collection Time: 09/25/18 12:50 PM  Result Value Ref Range Status   MRSA by PCR NEGATIVE NEGATIVE Final    Comment:        The GeneXpert MRSA Assay (FDA approved for NASAL specimens only), is one component of a comprehensive MRSA colonization surveillance program. It is not intended to diagnose MRSA infection nor to guide or monitor treatment for MRSA infections. Performed at [facility], [address] [place], [place] [address]   Culture, Urine     Status: None   Collection Time: 09/26/18  2:10 PM  Result Value Ref Range Status   Specimen Description URINE, CLEAN CATCH  Final   Special Requests   Final    NONE Performed at [facility], [address] [place], [place] [address]    Culture   Final    Multiple bacterial morphotypes present, none predominant. Suggest appropriate recollection if clinically indicated.   Report Status 09/27/2018 FINAL  Final  Culture, blood (routine x 2)     Status: None (Preliminary result)   Collection Time: 09/27/18  2:20 PM  Result Value Ref Range Status   Specimen Description BLOOD RIGHT HAND  Final   Special Requests AEROBIC  BOTTLE ONLY Blood Culture adequate volume  Final   Culture NO GROWTH 4 DAYS  Final   Report Status PENDING  Incomplete  Culture, blood (routine x 2)     Status: None (Preliminary result)   Collection Time: 09/27/18  2:25 PM  Result Value Ref Range Status   Specimen Description BLOOD RIGHT HAND  Final   Special Requests   Final    AEROBIC BOTTLE ONLY Blood Culture results may not be optimal due to an excessive volume of blood received in culture bottles   Culture NO GROWTH 4 DAYS  Final   Report Status PENDING  Incomplete       Radiology Studies: Dg Abd Portable 1v  Result Date: 09/29/2018 CLINICAL DATA:  Feeding tube placement EXAM: PORTABLE ABDOMEN - 1 VIEW COMPARISON:  None. FINDINGS: The tip of the weighted enteric tube projects in the expected location of the gastroduodenal junction. No dilated small bowel. There is severe left convex scoliosis. Visualized lung bases are clear. IMPRESSION: Tip of the weighted enteric tube projects in the expected location of the gastroduodenal junction. Electronically Signed   By: Deatra Robinson M.D.   On: 09/29/2018 16:58      Scheduled Meds: . free water  30 mL Per Tube Q4H  . levOCARNitine  250 mg Per Tube Daily  . nutrition supplement (JUVEN)  1 packet Per Tube BID BM  . phenytoin (DILANTIN) IV  100 mg Intravenous Q8H  . potassium chloride  40 mEq Per Tube Once  . sodium chloride flush  10-40 mL Intracatheter Q12H   Continuous Infusions: . feeding supplement (JEVITY 1.2 CAL) 1,000 mL (10/01/18 0910)  . lacosamide (VIMPAT) IV 200 mg (10/01/18 0502)  . levETIRAcetam 1,500 mg (10/01/18 0459)  . magnesium sulfate 1 - 4 g bolus IVPB    . meropenem (MERREM) IV 1 g (10/01/18 0517)  . thiamine  injection 500 mg (10/01/18 0915)     LOS: 6 days    Time spent: 20 minutes   Noralee StainJennifer Cienna Dumais, DO [facility] [email or web address] Password [email or web address] 10/01/2018, 12:09 PM

## 2018-10-01 NOTE — Progress Notes (Signed)
Patient mother is concerned about the amount of feeding that patient is getting stating "I am used to giving him feeds from a can". Also stated that she wants to keep the feeds at 60/hr since he has had multiple bowel movements in the last 24 hours.  Dr. Lenna Gilfordrdered imodium if loose stools continue.  Patient is more alert and responsive with less spasmic movement of the RUE.

## 2018-10-02 LAB — CULTURE, BLOOD (ROUTINE X 2)
CULTURE: NO GROWTH
Culture: NO GROWTH
Special Requests: ADEQUATE

## 2018-10-02 LAB — BASIC METABOLIC PANEL
Anion gap: 7 (ref 5–15)
BUN: 15 mg/dL (ref 6–20)
CO2: 21 mmol/L — ABNORMAL LOW (ref 22–32)
Calcium: 8.4 mg/dL — ABNORMAL LOW (ref 8.9–10.3)
Chloride: 106 mmol/L (ref 98–111)
Creatinine, Ser: 0.6 mg/dL — ABNORMAL LOW (ref 0.61–1.24)
GFR calc Af Amer: >60 mL/min (ref >60)
GFR calc non Af Amer: >60 mL/min (ref >60)
Glucose, Bld: 119 mg/dL — ABNORMAL HIGH (ref 70–99)
Potassium: 4.3 mmol/L (ref 3.5–5.1)
Sodium: 134 mmol/L — ABNORMAL LOW (ref 135–145)

## 2018-10-02 LAB — CBC
HCT: 34.6 % — ABNORMAL LOW (ref 39.0–52.0)
Hemoglobin: 11.5 g/dL — ABNORMAL LOW (ref 13.0–17.0)
MCH: 28.7 pg (ref 26.0–34.0)
MCHC: 33.2 g/dL (ref 30.0–36.0)
MCV: 86.3 fL (ref 80.0–100.0)
Platelets: 431 10*3/uL — ABNORMAL HIGH (ref 150–400)
RBC: 4.01 MIL/uL — AB (ref 4.22–5.81)
RDW: 14.9 % (ref 11.5–15.5)
WBC: 10.3 10*3/uL (ref 4.0–10.5)
nRBC: 0 % (ref 0.0–0.2)

## 2018-10-02 LAB — MAGNESIUM: Magnesium: 2.1 mg/dL (ref 1.7–2.4)

## 2018-10-02 LAB — PHOSPHORUS: Phosphorus: 2.1 mg/dL — ABNORMAL LOW (ref 2.5–4.6)

## 2018-10-02 MED ORDER — SODIUM CHLORIDE 0.9 % IV SOLN
INTRAVENOUS | Status: DC | PRN
  Administered 2018-10-02: 500 mL via INTRAVENOUS

## 2018-10-02 NOTE — Progress Notes (Signed)
PROGRESS NOTE    Jason Glover.  ZOX:[id number] DOB: June 20, 1991 DOA: 09/25/2018 PCP: Johny Blamer, MD     Brief Narrative:  Jason Glover. is a 27 year old male with history of cerebral palsy, mitochondrial Leigh syndrome, pressure ulcers, feeding tube, was transferred from [facility] after presenting there with fevers and uncontrollable right upper extremity movements concerning for seizures.  At baseline he is able to communicate and uses a motorized wheelchair.  He participates in group activities and apparently he performs really well.  Since last Tuesday he has been nonverbal and has been having twitching movements of the right upper extremity and face.  New events last 24 hours / Subjective: No new events.  Patient currently receiving tube feeds through cortrack.  Mother at bedside states that he has been more alert and improved over the holiday.  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 Avoid valproic acid.  -Resume levocarnitine  -Seizure precautions  -Neurology following. Patient needs follow up with Dr. Sharene Skeans  -IV thiamine started    Fever -Patient was started empirically on acyclovir, aztreonam and vancomycin --> merrem   -UA negative for infection, urine culture with multiple bacterial morphotypes present -Blood culture negative to date  -Flu negative   -Sacral ulcer, stage II without acute infection, stage I ulcers found on left lateral malleolus as well as right foot which do not appear infected -Unclear  source of infection found    Mitochondrial Leigh syndrome -Resume levocarnitine   G tube displacement  -IR to evaluate -Cortrak for nutrition in the meanwhile    DVT prophylaxis: SCDs Code Status: Full Family Communication: Mother at bedside Disposition Plan: Pending improvement, will need G tube evaluated and replaced once supply in stock in few days per IR    Consultants:   Neurology  IR   Procedures:   Continuous EEG   Antimicrobials:  Anti-infectives (From admission, onward)   Start     Dose/Rate Route Frequency Ordered Stop   09/29/18 1400  meropenem (MERREM) 1 g in sodium chloride 0.9 % 100 mL IVPB     1 g 200 mL/hr over 30 Minutes Intravenous Every 8 hours 09/29/18 1306 10/02/18 2359   09/26/18 1100  vancomycin (VANCOCIN) 500 mg in sodium chloride 0.9 % 100 mL IVPB  Status:  Discontinued     500 mg 100 mL/hr over 60 Minutes Intravenous Every 8 hours 09/26/18 1015 09/28/18 1224   09/25/18 1700  vancomycin (VANCOCIN) IVPB 1000 mg/200 mL premix     1,000 mg 200 mL/hr over 60 Minutes Intravenous  Once 09/25/18 1608 09/25/18 2000   09/25/18 1700  aztreonam (AZACTAM) 2 g in sodium chloride 0.9 % 100 mL IVPB  Status:  Discontinued     2 g 200 mL/hr over 30 Minutes Intravenous Every 8 hours 09/25/18 1608 09/29/18 1301   09/25/18 1700  acyclovir (ZOVIRAX) 450 mg in dextrose 5 % 100 mL IVPB  Status:  Discontinued     450 mg 109 mL/hr over 60 Minutes Intravenous Every 8 hours 09/25/18 1608 09/29/18 1301  Objective: Vitals:   10/02/18 0500 10/02/18 0506 10/02/18 0600 10/02/18 0735  BP:  118/73  (!) 126/93  Pulse: (!) 115 (!) 116 (!) 111 (!) 114  Resp: (!) 22 16 20 20   Temp:  99.7 F (37.6 C)  99.5 F (37.5 C)  TempSrc:  Axillary  Oral  SpO2: 99% 100% 100% 100%  Weight:      Height:        Intake/Output Summary (Last 24 hours) at 10/02/2018 1230 Last data filed at 10/02/2018 0600 Gross per 24 hour  Intake 2377.53 ml  Output 2550 ml  Net -172.47 ml     Filed Weights   09/25/18 1245  Weight: 47.8 kg    Examination: General exam: Appears calm and comfortable, chronically ill-appearing, cachectic Respiratory system: Clear to auscultation. Respiratory effort normal. Cardiovascular system: S1 & S2 heard, tachycardic, regular rhythm. No JVD, murmurs, rubs, gallops or clicks. No pedal edema. Gastrointestinal system: Abdomen is nondistended, soft and nontender. No organomegaly or masses felt. Normal bowel sounds heard. Central nervous system: Alert, does not respond to voice or interact Extremities: + Contractures Skin: + Stage II sacral ulcer without acute infection, or drainage, stage I ulcers left lateral malleolus and right foot without acute infections    Data Reviewed: I have personally reviewed following labs and imaging studies  CBC: Recent Labs  Lab 09/25/18 1412 09/26/18 0455 09/27/18 0743 09/28/18 0733 09/29/18 0121 09/30/18 0635 10/01/18 0612 10/02/18 0342  WBC 9.9 8.8 10.9* 7.6 8.8 9.7 9.9 10.3  NEUTROABS 7.4 5.8 7.4  --   --   --   --   --   HGB 11.4* 11.2* 10.9* 8.9* 9.3* 10.9* 10.9* 11.5*  HCT 34.3* 34.7* 31.9* 27.2* 28.8* 31.4* 34.0* 34.6*  MCV 85.8 88.3 82.6 88.0 86.2 85.1 86.5 86.3  PLT 345 324 351 289 338 252 411* 431*   Basic Metabolic Panel: Recent Labs  Lab 09/25/18 1412  09/28/18 0523 09/28/18 0733 09/29/18 0121 09/30/18 0905 10/01/18 0612 10/02/18 0342  NA 138   < > 140  --  142 139 140 134*  K 3.6   < > 3.1*  --  3.4* 4.3 3.6 4.3  CL 109   < > 114*  --  113* 111 110 106  CO2 17*   < > 17*  --  18* 16* 20* 21*  GLUCOSE 95   < > 100*  --  106* 120* 112* 119*  BUN 8   < > <5*  --  <5* 7 12 15   CREATININE 0.61   < > 0.62  --  0.59* 0.52* 0.56* 0.60*  CALCIUM 9.0   < > 8.5*  --  8.5* 8.7* 8.3* 8.4*  MG 1.8  --   --  1.7 1.7 1.8 1.8 2.1  PHOS 3.5  --   --   --  3.2 3.3 2.1* 2.1*   < > = values in this interval not displayed.   GFR: Estimated Creatinine Clearance: 92.9 mL/min (A) (by C-G  formula based on SCr of 0.6 mg/dL (L)). Liver Function Tests: Recent Labs  Lab 09/25/18 1412 09/26/18 0455  AST 18 19  ALT 24 22  ALKPHOS 90 75  BILITOT 0.8 0.6  PROT 6.5 6.1*  ALBUMIN 2.9* 2.6*   No results for input(s): LIPASE, AMYLASE in the last 168 hours. No results for input(s): AMMONIA in the last 168 hours. Coagulation Profile: Recent Labs  Lab 09/25/18 1412  INR 1.16   Cardiac Enzymes: No results for  input(s): CKTOTAL, CKMB, CKMBINDEX, TROPONINI in the last 168 hours. BNP (last 3 results) No results for input(s): PROBNP in the last 8760 hours. HbA1C: No results for input(s): HGBA1C in the last 72 hours. CBG: No results for input(s): GLUCAP in the last 168 hours. Lipid Profile: No results for input(s): CHOL, HDL, LDLCALC, TRIG, CHOLHDL, LDLDIRECT in the last 72 hours. Thyroid Function Tests: No results for input(s): TSH, T4TOTAL, FREET4, T3FREE, THYROIDAB in the last 72 hours. Anemia Panel: No results for input(s): VITAMINB12, FOLATE, FERRITIN, TIBC, IRON, RETICCTPCT in the last 72 hours. Sepsis Labs: No results for input(s): PROCALCITON, LATICACIDVEN in the last 168 hours.  Recent Results (from the past 240 hour(s))  MRSA PCR Screening     Status: None   Collection Time: 09/25/18 12:50 PM  Result Value Ref Range Status   MRSA by PCR NEGATIVE NEGATIVE Final    Comment:        The GeneXpert MRSA Assay (FDA approved for NASAL specimens only), is one component of a comprehensive MRSA colonization surveillance program. It is not intended to diagnose MRSA infection nor to guide or monitor treatment for MRSA infections. Performed at [facility], [address] [place], [place] [address]   Culture, Urine     Status: None   Collection Time: 09/26/18  2:10 PM  Result Value Ref Range Status   Specimen Description URINE, CLEAN CATCH  Final   Special Requests   Final    NONE Performed at [facility], [address] [place], [place] [address]      Culture   Final    Multiple bacterial morphotypes present, none predominant. Suggest appropriate recollection if clinically indicated.   Report Status 09/27/2018 FINAL  Final  Culture, blood (routine x 2)     Status: None (Preliminary result)   Collection Time: 09/27/18  2:20 PM  Result Value Ref Range Status   Specimen Description BLOOD RIGHT HAND  Final   Special Requests AEROBIC BOTTLE ONLY Blood Culture adequate volume  Final   Culture NO GROWTH 4 DAYS  Final   Report Status PENDING  Incomplete  Culture, blood (routine x 2)     Status: None (Preliminary result)   Collection Time: 09/27/18  2:25 PM  Result Value Ref Range Status   Specimen Description BLOOD RIGHT HAND  Final   Special Requests   Final    AEROBIC BOTTLE ONLY Blood Culture results may not be optimal due to an excessive volume of blood received in culture bottles   Culture NO GROWTH 4 DAYS  Final   Report Status PENDING  Incomplete       Radiology Studies: No results found.    Scheduled Meds: . free water  30 mL Per Tube Q4H  . levOCARNitine  250 mg Per Tube Daily  . nutrition supplement (JUVEN)  1 packet Per Tube BID BM  . phenytoin (DILANTIN) IV  100 mg Intravenous Q8H  . sodium chloride flush  10-40 mL Intracatheter Q12H   Continuous Infusions: . feeding supplement (JEVITY 1.2 CAL) 1,000 mL (10/01/18 0910)  . lacosamide (VIMPAT) IV 200 mg (10/02/18 0425)  . levETIRAcetam 1,500 mg (10/02/18 0423)  . meropenem (MERREM) IV 1 g (10/02/18 0527)  . thiamine injection 500 mg (10/02/18 1020)     LOS: 7 days    Time spent: 20 minutes   Noralee StainJennifer Hildagarde Holleran, DO [facility] [email or web address] Password [email or web address] 10/02/2018, 12:30 PM

## 2018-10-03 ENCOUNTER — Encounter (HOSPITAL_COMMUNITY): Payer: Self-pay

## 2018-10-03 LAB — CBC
HCT: 36.1 % — ABNORMAL LOW (ref 39.0–52.0)
Hemoglobin: 11.5 g/dL — ABNORMAL LOW (ref 13.0–17.0)
MCH: 27.4 pg (ref 26.0–34.0)
MCHC: 31.9 g/dL (ref 30.0–36.0)
MCV: 86.2 fL (ref 80.0–100.0)
PLATELETS: 456 10*3/uL — AB (ref 150–400)
RBC: 4.19 MIL/uL — ABNORMAL LOW (ref 4.22–5.81)
RDW: 14.9 % (ref 11.5–15.5)
WBC: 9.5 10*3/uL (ref 4.0–10.5)
nRBC: 0 % (ref 0.0–0.2)

## 2018-10-03 LAB — PHENYTOIN LEVEL, FREE AND TOTAL
PHENYTOIN, TOTAL: 19.3 ug/mL (ref 10.0–20.0)
Phenytoin, Free: 2.4 ug/mL — ABNORMAL HIGH (ref 1.0–2.0)

## 2018-10-03 LAB — BASIC METABOLIC PANEL
Anion gap: 10 (ref 5–15)
BUN: 14 mg/dL (ref 6–20)
CALCIUM: 8.8 mg/dL — AB (ref 8.9–10.3)
CO2: 22 mmol/L (ref 22–32)
Chloride: 103 mmol/L (ref 98–111)
Creatinine, Ser: 0.64 mg/dL (ref 0.61–1.24)
GFR calc Af Amer: >60 mL/min (ref >60)
GFR calc non Af Amer: >60 mL/min (ref >60)
Glucose, Bld: 106 mg/dL — ABNORMAL HIGH (ref 70–99)
Potassium: 4.4 mmol/L (ref 3.5–5.1)
Sodium: 135 mmol/L (ref 135–145)

## 2018-10-03 LAB — MAGNESIUM: Magnesium: 2.1 mg/dL (ref 1.7–2.4)

## 2018-10-03 LAB — PHOSPHORUS: Phosphorus: 2.6 mg/dL (ref 2.5–4.6)

## 2018-10-03 NOTE — Progress Notes (Signed)
Nutrition Follow-up  DOCUMENTATION CODES:   Severe malnutrition in context of acute illness/injury  INTERVENTION:  Continue Jevity 1.2 formula via Cortrak NGT at goal rate of 75 ml/hr.  Continue free water flushes of 30 ml q 4 hours. (MD to adjust as appropriate)  Tube feeding to provide 2160 kcal, 100 grams of protein, 1638 ml water.   Continue 1 packet Juven BID per tube, each packet provides 80 calories, 8 grams of carbohydrate, and 14 grams of amino acids; supplement contains CaHMB, glutamine, and arginine, to promote wound healing  NUTRITION DIAGNOSIS:   Severe Malnutrition related to acute illness as evidenced by moderate fat depletion, severe muscle depletion, energy intake < or equal to 50% for > or equal to 5 days, percent weight loss; ongoing  GOAL:   Patient will meet greater than or equal to 90% of their needs; met with TF  MONITOR:   Diet advancement, Weight trends, PO intake, Skin, TF tolerance  REASON FOR ASSESSMENT:   Consult Enteral/tube feeding initiation and management  ASSESSMENT:   27 yo male, admitted with seizures. PMH significant for mitochondreal Leigh syndrome, pressure ulcers. Has PEG tube. Home meds list includes vitamin B-complex, ferrous sulfate, L-carnitine, Miralax.  Pt has been tolerating his tube feedings well. Noted G-tube displaced with need for replacement. Per IR, currently awaiting G-tube supply to come in. Pt continues on Cortrak NGT for nutrition in the meanwhile. RD to continue with current tube feeding orders. Will continue with Juven to aid in wound healing. Once G-tube replaced and ready for use, recommend transitioning back to home tube feeding regimen. Labs and medications reviewed.   Diet Order:   Diet Order    None      EDUCATION NEEDS:   Not appropriate for education at this time  Skin:  Skin Assessment: Skin Integrity Issues: Skin Integrity Issues:: Stage II, Stage I Stage I: R foot Stage II: L  buttocks Unstageable: N/A  Last BM:  12/26  Height:   Ht Readings from Last 1 Encounters:  09/29/18 _0  (1.753 m)    Weight:   Wt Readings from Last 1 Encounters:  09/25/18 47.8 kg    Ideal Body Weight:  72.7 kg  BMI:  Body mass index is 15.56 kg/m.  Estimated Nutritional Needs:   Kcal:  0149-9692 calories daily (30-35 kcal/kg IBW)  Protein:  87-109 gm daily (1.2-1.5 g/kg IBW)  Fluid:  >/= 2.1 L daily or per MD discretion    Corrin Parker, MS, RD, LDN Pager # [phone] After hours/ weekend pager # [phone]

## 2018-10-03 NOTE — Progress Notes (Signed)
PROGRESS NOTE    Jason Heaterhaddeus R Laster Jr.  ZOX:[id number] DOB: 11/20/1990 DOA: 09/25/2018 PCP: [name], [name], MD     Brief Narrative:  Jason Heaterhaddeus R Rockman Jr. is a 27 year old male with history of cerebral palsy, mitochondrial Leigh syndrome, pressure ulcers, feeding tube, was transferred from [facility] after presenting there with fevers and uncontrollable right upper extremity movements concerning for seizures.  At baseline he is able to communicate and uses a motorized wheelchair.  He participates in group activities and apparently he performs really well.  Since last Tuesday he has been nonverbal and has been having twitching movements of the right upper extremity and face.  New events last 24 hours / Subjective: Mother at bedside, states that patient has been improving this week  Assessment & Plan:   Principal Problem:   Seizures (HCC) Active Problems:   Pressure injury of skin   Fever   Mitochondrial Leigh syndrome   PEG tube malfunction (HCC)   Constipation   Protein-calorie malnutrition, severe   Seizure disorder with concern for status epilepticus -Continuous EEG without electrographic seizures, but can miss simple partial status  -MRI brain completed, consistent with findings of Leigh's syndrome  -Continue vimpat, dilantin, keppra. Avoid valproic acid.  -Resume levocarnitine  -Seizure precautions  -IV thiamine finished 5-day course -Neurology following. Patient needs follow up with Dr. Sharene SkeansHickling   Fever -Patient was started empirically on acyclovir, aztreonam and vancomycin --> merrem   -UA negative for infection, urine culture with multiple bacterial morphotypes present -Blood culture negative to date  -Flu negative   -Sacral ulcer, stage II without acute infection, stage I ulcers found on left lateral malleolus as well as right foot which do not appear infected -Unclear source of infection found   -Afebrile last 24 hours, could consider PE/DVT  work-up.  He remains on room air without any respiratory distress, tachycardic on examination  Mitochondrial Leigh syndrome -Resume levocarnitine   G tube displacement  -IR to evaluate, waiting for supply to come in -Cortrak for nutrition in the meanwhile    DVT prophylaxis: SCDs Code Status: Full Family Communication: Mother at bedside Disposition Plan: Pending improvement, will need G tube evaluated and replaced once supply in stock in few days per IR    Consultants:   Neurology  IR   Procedures:   Continuous EEG   Antimicrobials:  Anti-infectives (From admission, onward)   Start     Dose/Rate Route Frequency Ordered Stop   09/29/18 1400  meropenem (MERREM) 1 g in sodium chloride 0.9 % 100 mL IVPB     1 g 200 mL/hr over 30 Minutes Intravenous Every 8 hours 09/29/18 1306 10/02/18 2130   09/26/18 1100  vancomycin (VANCOCIN) 500 mg in sodium chloride 0.9 % 100 mL IVPB  Status:  Discontinued     500 mg 100 mL/hr over 60 Minutes Intravenous Every 8 hours 09/26/18 1015 09/28/18 1224   09/25/18 1700  vancomycin (VANCOCIN) IVPB 1000 mg/200 mL premix     1,000 mg 200 mL/hr over 60 Minutes Intravenous  Once 09/25/18 1608 09/25/18 2000   09/25/18 1700  aztreonam (AZACTAM) 2 g in sodium chloride 0.9 % 100 mL IVPB  Status:  Discontinued     2 g 200 mL/hr over 30 Minutes Intravenous Every 8 hours 09/25/18 1608 09/29/18 1301   09/25/18 1700  acyclovir (ZOVIRAX) 450 mg in dextrose 5 % 100 mL IVPB  Status:  Discontinued     450 mg 109 mL/hr over 60 Minutes Intravenous Every 8  hours 09/25/18 1608 09/29/18 1301       Objective: Vitals:   10/02/18 2343 10/03/18 0400 10/03/18 0849 10/03/18 1127  BP: 128/85 124/86 120/78 124/79  Pulse: (!) 105 (!) 115  (!) 117  Resp: 13 15  16   Temp: 98.8 F (37.1 C) (!) 97.5 F (36.4 C) 97.9 F (36.6 C) 98.8 F (37.1 C)  TempSrc: Axillary Axillary Axillary Axillary  SpO2: 100% 100%  100%  Weight:      Height:        Intake/Output  Summary (Last 24 hours) at 10/03/2018 1259 Last data filed at 10/03/2018 0700 Gross per 24 hour  Intake 2514.29 ml  Output 2150 ml  Net 364.29 ml   Filed Weights   09/25/18 1245  Weight: 47.8 kg     Examination: General exam: Appears calm and comfortable, chronically ill-appearing, cachectic with contractures of extremities Respiratory system: Clear to auscultation. Respiratory effort normal. Cardiovascular system: S1 & S2 heard, tachycardic, regular rhythm. No JVD, murmurs, rubs, gallops or clicks. No pedal edema. Gastrointestinal system: Abdomen is nondistended, soft  Central nervous system: Alert, does not respond to voice or interact Extremities: Contractures of extremities    Data Reviewed: I have personally reviewed following labs and imaging studies  CBC: Recent Labs  Lab 09/27/18 0743  09/29/18 0121 09/30/18 0635 10/01/18 0612 10/02/18 0342 10/03/18 0332  WBC 10.9*   < > 8.8 9.7 9.9 10.3 9.5  NEUTROABS 7.4  --   --   --   --   --   --   HGB 10.9*   < > 9.3* 10.9* 10.9* 11.5* 11.5*  HCT 31.9*   < > 28.8* 31.4* 34.0* 34.6* 36.1*  MCV 82.6   < > 86.2 85.1 86.5 86.3 86.2  PLT 351   < > 338 252 411* 431* 456*   < > = values in this interval not displayed.   Basic Metabolic Panel: Recent Labs  Lab 09/29/18 0121 09/30/18 0905 10/01/18 0612 10/02/18 0342 10/03/18 0332  NA 142 139 140 134* 135  K 3.4* 4.3 3.6 4.3 4.4  CL 113* 111 110 106 103  CO2 18* 16* 20* 21* 22  GLUCOSE 106* 120* 112* 119* 106*  BUN <5* 7 12 15 14   CREATININE 0.59* 0.52* 0.56* 0.60* 0.64  CALCIUM 8.5* 8.7* 8.3* 8.4* 8.8*  MG 1.7 1.8 1.8 2.1 2.1  PHOS 3.2 3.3 2.1* 2.1* 2.6   GFR: Estimated Creatinine Clearance: 92.9 mL/min (by C-G formula based on SCr of 0.64 mg/dL). Liver Function Tests: No results for input(s): AST, ALT, ALKPHOS, BILITOT, PROT, ALBUMIN in the last 168 hours. No results for input(s): LIPASE, AMYLASE in the last 168 hours. No results for input(s): AMMONIA in the last  168 hours. Coagulation Profile: No results for input(s): INR, PROTIME in the last 168 hours. Cardiac Enzymes: No results for input(s): CKTOTAL, CKMB, CKMBINDEX, TROPONINI in the last 168 hours. BNP (last 3 results) No results for input(s): PROBNP in the last 8760 hours. HbA1C: No results for input(s): HGBA1C in the last 72 hours. CBG: No results for input(s): GLUCAP in the last 168 hours. Lipid Profile: No results for input(s): CHOL, HDL, LDLCALC, TRIG, CHOLHDL, LDLDIRECT in the last 72 hours. Thyroid Function Tests: No results for input(s): TSH, T4TOTAL, FREET4, T3FREE, THYROIDAB in the last 72 hours. Anemia Panel: No results for input(s): VITAMINB12, FOLATE, FERRITIN, TIBC, IRON, RETICCTPCT in the last 72 hours. Sepsis Labs: No results for input(s): PROCALCITON, LATICACIDVEN in the last 168 hours.  Recent Results (from the past 240 hour(s))  MRSA PCR Screening     Status: None   Collection Time: 09/25/18 12:50 PM  Result Value Ref Range Status   MRSA by PCR NEGATIVE NEGATIVE Final    Comment:        The GeneXpert MRSA Assay (FDA approved for NASAL specimens only), is one component of a comprehensive MRSA colonization surveillance program. It is not intended to diagnose MRSA infection nor to guide or monitor treatment for MRSA infections. Performed at [facility], [address] [place], [place] [address]   Culture, Urine     Status: None   Collection Time: 09/26/18  2:10 PM  Result Value Ref Range Status   Specimen Description URINE, CLEAN CATCH  Final   Special Requests   Final    NONE Performed at [facility], [address], [place], [place] [address]    Culture   Final    Multiple bacterial morphotypes present, none predominant. Suggest appropriate recollection if clinically indicated.   Report Status 09/27/2018 FINAL  Final  Culture, blood (routine x 2)     Status: None   Collection Time: 09/27/18  2:20 PM  Result Value Ref Range Status    Specimen Description BLOOD RIGHT HAND  Final   Special Requests AEROBIC BOTTLE ONLY Blood Culture adequate volume  Final   Culture   Final    NO GROWTH 5 DAYS Performed at [facility], [address], [place], [place] [address]    Report Status 10/02/2018 FINAL  Final  Culture, blood (routine x 2)     Status: None   Collection Time: 09/27/18  2:25 PM  Result Value Ref Range Status   Specimen Description BLOOD RIGHT HAND  Final   Special Requests   Final    AEROBIC BOTTLE ONLY Blood Culture results may not be optimal due to an excessive volume of blood received in culture bottles   Culture   Final    NO GROWTH 5 DAYS Performed at [facility], [address], [place], [place] [address]    Report Status 10/02/2018 FINAL  Final       Radiology Studies: No results found.    Scheduled Meds: . free water  30 mL Per Tube Q4H  . levOCARNitine  250 mg Per Tube Daily  . nutrition supplement (JUVEN)  1 packet Per Tube BID BM  . phenytoin (DILANTIN) IV  100 mg Intravenous Q8H   Continuous Infusions: . sodium chloride 500 mL (10/02/18 2057)  . feeding supplement (JEVITY 1.2 CAL) 1,000 mL (10/02/18 2347)  . lacosamide (VIMPAT) IV 200 mg (10/03/18 0522)  . levETIRAcetam 1,500 mg (10/03/18 0530)     LOS: 8 days    Time spent: 20 minutes   Noralee Stain, DO [facility] [email or web address] Password [email or web address] 10/03/2018, 12:59 PM

## 2018-10-04 ENCOUNTER — Inpatient Hospital Stay (HOSPITAL_COMMUNITY): Payer: Medicaid Other

## 2018-10-04 DIAGNOSIS — A419 Sepsis, unspecified organism: Secondary | ICD-10-CM

## 2018-10-04 LAB — PHOSPHORUS: Phosphorus: 2.3 mg/dL — ABNORMAL LOW (ref 2.5–4.6)

## 2018-10-04 LAB — BASIC METABOLIC PANEL
ANION GAP: 11 (ref 5–15)
BUN: 20 mg/dL (ref 6–20)
CO2: 21 mmol/L — ABNORMAL LOW (ref 22–32)
Calcium: 8.8 mg/dL — ABNORMAL LOW (ref 8.9–10.3)
Chloride: 102 mmol/L (ref 98–111)
Creatinine, Ser: 0.57 mg/dL — ABNORMAL LOW (ref 0.61–1.24)
GFR calc Af Amer: >60 mL/min (ref >60)
GFR calc non Af Amer: >60 mL/min (ref >60)
Glucose, Bld: 103 mg/dL — ABNORMAL HIGH (ref 70–99)
Potassium: 4.5 mmol/L (ref 3.5–5.1)
Sodium: 134 mmol/L — ABNORMAL LOW (ref 135–145)

## 2018-10-04 LAB — CBC
HCT: 35 % — ABNORMAL LOW (ref 39.0–52.0)
HCT: 34.9 % — ABNORMAL LOW (ref 39.0–52.0)
Hemoglobin: 11.5 g/dL — ABNORMAL LOW (ref 13.0–17.0)
Hemoglobin: 11.4 g/dL — ABNORMAL LOW (ref 13.0–17.0)
MCH: 28.5 pg (ref 26.0–34.0)
MCH: 28 pg (ref 26.0–34.0)
MCHC: 32.9 g/dL (ref 30.0–36.0)
MCHC: 32.7 g/dL (ref 30.0–36.0)
MCV: 86.8 fL (ref 80.0–100.0)
MCV: 85.7 fL (ref 80.0–100.0)
NRBC: 0 % (ref 0.0–0.2)
Platelets: 471 10*3/uL — ABNORMAL HIGH (ref 150–400)
Platelets: 462 10*3/uL — ABNORMAL HIGH (ref 150–400)
RBC: 4.03 MIL/uL — ABNORMAL LOW (ref 4.22–5.81)
RBC: 4.07 MIL/uL — ABNORMAL LOW (ref 4.22–5.81)
RDW: 15 % (ref 11.5–15.5)
RDW: 15.2 % (ref 11.5–15.5)
WBC: 11 10*3/uL — AB (ref 4.0–10.5)
WBC: 13.2 10*3/uL — ABNORMAL HIGH (ref 4.0–10.5)
nRBC: 0 % (ref 0.0–0.2)

## 2018-10-04 LAB — MAGNESIUM: MAGNESIUM: 2.1 mg/dL (ref 1.7–2.4)

## 2018-10-04 LAB — LACTIC ACID, PLASMA: Lactic Acid, Venous: 1.3 mmol/L (ref 0.5–1.9)

## 2018-10-04 MED ORDER — METHYLPREDNISOLONE SODIUM SUCC 125 MG IJ SOLR
80.0000 mg | Freq: Two times a day (BID) | INTRAMUSCULAR | Status: DC
  Administered 2018-10-04 – 2018-10-06 (×4): 80 mg via INTRAVENOUS
  Filled 2018-10-04 (×3): qty 2

## 2018-10-04 MED ORDER — DIPHENHYDRAMINE HCL 50 MG/ML IJ SOLN
INTRAMUSCULAR | Status: AC
  Filled 2018-10-04: qty 1

## 2018-10-04 MED ORDER — VANCOMYCIN HCL IN DEXTROSE 750-5 MG/150ML-% IV SOLN
750.0000 mg | Freq: Three times a day (TID) | INTRAVENOUS | Status: DC
  Administered 2018-10-05 – 2018-10-07 (×7): 750 mg via INTRAVENOUS
  Filled 2018-10-04 (×9): qty 150

## 2018-10-04 MED ORDER — METHYLPREDNISOLONE SODIUM SUCC 125 MG IJ SOLR
INTRAMUSCULAR | Status: AC
  Administered 2018-10-04: 80 mg via INTRAVENOUS
  Filled 2018-10-04: qty 2

## 2018-10-04 MED ORDER — SODIUM CHLORIDE 0.9 % IV SOLN
1.0000 g | Freq: Three times a day (TID) | INTRAVENOUS | Status: DC
  Administered 2018-10-04 – 2018-10-08 (×12): 1 g via INTRAVENOUS
  Filled 2018-10-04 (×14): qty 1

## 2018-10-04 MED ORDER — METHYLPREDNISOLONE SODIUM SUCC 125 MG IJ SOLR
80.0000 mg | Freq: Once | INTRAMUSCULAR | Status: AC
  Administered 2018-10-04: 80 mg via INTRAVENOUS
  Filled 2018-10-04: qty 2

## 2018-10-04 MED ORDER — DIPHENHYDRAMINE HCL 50 MG/ML IJ SOLN
INTRAMUSCULAR | Status: AC
  Administered 2018-10-04: 25 mg via INTRAVENOUS
  Filled 2018-10-04: qty 1

## 2018-10-04 MED ORDER — SODIUM CHLORIDE 0.9 % IV SOLN: INTRAVENOUS | Status: DC | PRN

## 2018-10-04 MED ORDER — DIPHENHYDRAMINE HCL 50 MG/ML IJ SOLN
25.0000 mg | Freq: Three times a day (TID) | INTRAMUSCULAR | Status: DC | PRN
  Administered 2018-10-04 – 2018-10-07 (×5): 25 mg via INTRAVENOUS
  Filled 2018-10-04 (×4): qty 1

## 2018-10-04 MED ORDER — DIPHENHYDRAMINE HCL 50 MG/ML IJ SOLN
25.0000 mg | Freq: Once | INTRAMUSCULAR | Status: AC
  Administered 2018-10-04: 25 mg via INTRAVENOUS

## 2018-10-04 MED ORDER — DIPHENHYDRAMINE HCL 50 MG/ML IJ SOLN
12.5000 mg | Freq: Once | INTRAMUSCULAR | Status: AC
  Administered 2018-10-04: 12.5 mg via INTRAVENOUS
  Filled 2018-10-04: qty 1

## 2018-10-04 MED ORDER — PHENYTOIN SODIUM 50 MG/ML IJ SOLN
75.0000 mg | Freq: Three times a day (TID) | INTRAMUSCULAR | Status: DC
  Administered 2018-10-04 – 2018-10-08 (×11): 75 mg via INTRAVENOUS
  Filled 2018-10-04 (×12): qty 1.5

## 2018-10-04 MED ORDER — VANCOMYCIN HCL IN DEXTROSE 1-5 GM/200ML-% IV SOLN
1000.0000 mg | Freq: Once | INTRAVENOUS | Status: AC
  Administered 2018-10-04: 1000 mg via INTRAVENOUS
  Filled 2018-10-04: qty 200

## 2018-10-04 NOTE — Progress Notes (Signed)
By the time the benadryl is given, the upper lip swelling noted. Will reassess in 5 min

## 2018-10-04 NOTE — Progress Notes (Signed)
Patients right ear and right upper lip noted to have increase swelling. Hospital coverage paged and updated. Will continue to monitor and treat per orders

## 2018-10-04 NOTE — Progress Notes (Signed)
Pharmacy called about not to give at this time Dilantin due to increased levels. This medication is a posibility of symptoms that patient had - nystagmus. Per MD if more twitching noted call MD to come and assess.

## 2018-10-04 NOTE — Progress Notes (Signed)
MEDICATION RELATED CONSULT NOTE  Pharmacy Consult for Phenytoin Indication: seizures  Allergies  Allergen Reactions  . Ativan [Lorazepam]     Facial swelling  . Rocephin [Ceftriaxone] Hives    Patient Measurements: Height: 5\' 9"  (175.3 cm) Weight: 105 lb 6.1 oz (47.8 kg) IBW/kg (Calculated) : 70.7  Vital Signs: Temp: 101.4 F (38.6 C) (12/28 1145) Temp Source: Axillary (12/28 1145) BP: 137/76 (12/28 1145) Pulse Rate: 55 (12/28 1145) Intake/Output from previous day: 12/27 0701 - 12/28 0700 In: -  Out: 750 [Urine:750] Intake/Output from this shift: Total I/O In: 110.4 [I.V.:110.4] Out: -   Labs: Recent Labs    10/02/18 0342 10/03/18 0332 10/04/18 0443  WBC 10.3 9.5 11.0*  HGB 11.5* 11.5* 11.5*  HCT 34.6* 36.1* 35.0*  PLT 431* 456* 471*  CREATININE 0.60* 0.64 0.57*  MG 2.1 2.1 2.1  PHOS 2.1* 2.6 2.3*   Estimated Creatinine Clearance: 92.9 mL/min (A) (by C-G formula based on SCr of 0.57 mg/dL (L)).   Microbiology: Recent Results (from the past 720 hour(s))  MRSA PCR Screening     Status: None   Collection Time: 09/25/18 12:50 PM  Result Value Ref Range Status   MRSA by PCR NEGATIVE NEGATIVE Final    Comment:        The GeneXpert MRSA Assay (FDA approved for NASAL specimens only), is one component of a comprehensive MRSA colonization surveillance program. It is not intended to diagnose MRSA infection nor to guide or monitor treatment for MRSA infections. Performed at [facility], [address] [place], [place] [address]   Culture, Urine     Status: None   Collection Time: 09/26/18  2:10 PM  Result Value Ref Range Status   Specimen Description URINE, CLEAN CATCH  Final   Special Requests   Final    NONE Performed at [facility], [address] [place], [place] [address]    Culture   Final    Multiple bacterial morphotypes present, none predominant. Suggest appropriate recollection if clinically indicated.   Report Status  09/27/2018 FINAL  Final  Culture, blood (routine x 2)     Status: None   Collection Time: 09/27/18  2:20 PM  Result Value Ref Range Status   Specimen Description BLOOD RIGHT HAND  Final   Special Requests AEROBIC BOTTLE ONLY Blood Culture adequate volume  Final   Culture   Final    NO GROWTH 5 DAYS Performed at [facility], [address] [place], [place] [address]    Report Status 10/02/2018 FINAL  Final  Culture, blood (routine x 2)     Status: None   Collection Time: 09/27/18  2:25 PM  Result Value Ref Range Status   Specimen Description BLOOD RIGHT HAND  Final   Special Requests   Final    AEROBIC BOTTLE ONLY Blood Culture results may not be optimal due to an excessive volume of blood received in culture bottles   Culture   Final    NO GROWTH 5 DAYS Performed at [facility], [address] [place], [place] [address]    Report Status 10/02/2018 FINAL  Final    Medical History: Past Medical History:  Diagnosis Date  . Leigh syndrome (HCC)   . Mitochondrial Leigh syndrome   . Pressure ulcer     Medications:  Medications Prior to Admission  Medication Sig Dispense Refill Last Dose  . B Complex-Biotin-FA (B COMPLETE) TABS Take 1 tablet by mouth daily.  09/23/2018  . ferrous sulfate 325 (65 FE) MG EC tablet Take 325 mg by mouth daily with breakfast.   09/23/2018  . LevOCARNitine (L-CARNITINE) 250 MG CAPS Take 250 mg by mouth daily.   09/23/2018  . polyethylene glycol (MIRALAX / GLYCOLAX) packet Take 17 g by mouth every other day.   09/23/2018    Assessment: 27 year old male with history of cerebral palsy, mitochondrial Leigh syndrome, pressure ulcers, feeding tube [facility] after presenting with fevers and uncontrollable RUE movements concerning for seizures. Nurse reported today patient had an episode of eye movement back and forth and twitching of upper extremities.   Pharmacy consulted for phenytoin maintenance dosing, also continues on Keppra  and Vimpat.  Patient's free phenytoin came back supratherapeutic at 2.4 (goal 1.0 - 2.0) and the corrected total phenytoin level was supratherapeutic at 31.1 (goal 10 - 20) SCr wnl/stable, alb 2.6 (low) at baseline, LFTs wnl.   Goal of Therapy:  Phenytoin level 10-20 mcg/ml  Plan:  Hold 1400 phenytoin dose (confirmed with MD) Decrease phenytoin to 75 mg IV Q8H starting 12/28 at 2200 Will f/u phenytoin level in 5-7 days (at [facility])  Thank you for allowing pharmacy to be a part of this patient's care.  Lenord Carboebecca Fanning, PharmD PGY1 Pharmacy Resident Phone: [phone]  Please check AMION for all [facility] phone numbers 10/04/2018 2:08 PM

## 2018-10-04 NOTE — Progress Notes (Signed)
Per RN, prior to hand off, pt had twitching movements or eyes and Upper extremities. Ativan was given. Right after the report was given, family member came out and stated that the pt has a reaction. Pt assessed and small redness on bilateral ears were noted, as well as some smelling on RT ear and, per family, some swelling on forehead. Mother told he had this type of reaction had before and usually they give benadryl. MD called right away. Order for 25mg  of Benadryl once IV was placed. Continue to monitor closely.

## 2018-10-04 NOTE — Progress Notes (Signed)
Called into patients room by mother d/t patient swelling again. Patients right upper lip swelling again, and patients left upper eyelid swelling. Mother voices concern about airway. Patients O2 saturation 100% on RA NAD noted by patient at this time. Paged Hospitalist to inform. Will continue to monitor and treat patient per order.

## 2018-10-04 NOTE — Progress Notes (Signed)
Pharmacy Antibiotic Note  Jason Heaterhaddeus R Nusser Jr. is a 27 y.o. male admitted on 09/25/2018 with FUO. Pharmacy has been consulted for Meropenem / Vancomycin dosing. Was on acyclovir/aztreonam/vancomycin recently which was changed to meropenem (stopped 12/26). WBC increased to 11 today, Tmax 101.4. Scr 0.57 (CrCl 92 mL/min).   Was previously on 500 mg IV Q8h with Scr 0.62.   Plan: Meropenem 1 g IV Q8h Vancomycin 1 g IV once then 750 mg IV Q8h  Monitor clinical progress, c/s, renal function  Height: 5\' 9"  (175.3 cm) Weight: 105 lb 6.1 oz (47.8 kg) IBW/kg (Calculated) : 70.7  Temp (24hrs), Avg:100.4 F (38 C), Min:99.3 F (37.4 C), Max:101.4 F (38.6 C)  Recent Labs  Lab 09/28/18 1049  09/30/18 0635 09/30/18 0905 10/01/18 0612 10/02/18 0342 10/03/18 0332 10/04/18 0443  WBC  --    < > 9.7  --  9.9 10.3 9.5 11.0*  CREATININE  --    < >  --  0.52* 0.56* 0.60* 0.64 0.57*  VANCOTROUGH 17  --   --   --   --   --   --   --    < > = values in this interval not displayed.    Estimated Creatinine Clearance: 92.9 mL/min (A) (by C-G formula based on SCr of 0.57 mg/dL (L)).    Allergies  Allergen Reactions  . Ativan [Lorazepam]     Facial swelling  . Rocephin [Ceftriaxone] Hives   Antibiotics This Admission Vancomycin 12/19>>12/22, 12/28>> Acyclovir 12/19>>12/23 Aztreonam 12/19>>12/23 Meropenem 12/23>> 12/26, 12/28>>  Microbiology Results 12/21 flu - neg 12/21 bcx - ngtd 12/20 UC - neg 12/19 mrsa pcr - neg  Sherron MondayKimberly Allisson Schindel, PharmD, BCCCP Clinical Pharmacist  Pager: [phone] Phone: [phone] Please check AMION for all [facility] contact numbers 10/04/2018 7:17 PM

## 2018-10-04 NOTE — Progress Notes (Signed)
[facility]  PROGRESS NOTE  [name]. ZOX:[id number] DOB: 14-Jul-1991 DOA: 09/25/2018 PCP: Johny Blamer, MD   Brief HPI:   27 year old male with a history of cerebral palsy, mitochondrial leg syndrome, pressure ulcers, feeding tube was transferred from [facility] after presenting there with fever and uncontrollable right upper extremity movement concern for seizure.  At baseline patient is able to communicate and use motorized wheelchair.  Participates in group activities and apparently performs really well.  Since last Tuesday patient has been nonverbal and has been able to chew movements of right upper extremity and face.    Subjective   Patient seen and examined, continues to nonverbal.  Has been spiking temp T-max 101.3.  Also developed swelling in the upper lip along with erythema of right earlobe after he received IV Ativan.  Given Benadryl and Solu-Medrol.   Assessment/Plan:     1. Seizure disorder-with concern for status epilepticus, continues EEG showed no seizures.  MRI completed which showed findings consistent with leg syndrome.  Continue Vimpat, Dilantin, Keppra.  Avoid valproic acid.  Continue levocarnitine.  IV thiamine was given and completed 5-day course.  Neurology has signed off.  Patient will follow-up with Dr. Roel Cluck as outpatient.  2. Fever/SIRS-patient has been spiking temp for past 48 hours.  Today became tachycardic, tachypneic.  Concern for developing sepsis.  Will obtain UA, blood cultures, urine cultures, lactic acid, chest x-ray.  Start empirically on vancomycin and Azactam.  3. Mitochondrial leg syndrome-continue levocarnitine  4. G-tube displacement-IR to evaluate once supplies in.  Continue with core track nutrition in the meanwhile.  5. Angioedema-patient developed swelling of the upper lip and erythema of right earlobe after he received IV Ativan.  Will discontinue IV Ativan.  In case patient needs benzodiazepine he can  be given Versed 5 mg IV or diazepam 5 mg IV as needed for seizures.  Started on Solu-Medrol 80 mg IV every 12 hours, Benadryl 25 mg IV every 8 hours as needed     CBG: No results for input(s): GLUCAP in the last 168 hours.  CBC: Recent Labs  Lab 09/30/18 0635 10/01/18 0612 10/02/18 0342 10/03/18 0332 10/04/18 0443  WBC 9.7 9.9 10.3 9.5 11.0*  HGB 10.9* 10.9* 11.5* 11.5* 11.5*  HCT 31.4* 34.0* 34.6* 36.1* 35.0*  MCV 85.1 86.5 86.3 86.2 86.8  PLT 252 411* 431* 456* 471*    Basic Metabolic Panel: Recent Labs  Lab 09/30/18 0905 10/01/18 0612 10/02/18 0342 10/03/18 0332 10/04/18 0443  NA 139 140 134* 135 134*  K 4.3 3.6 4.3 4.4 4.5  CL 111 110 106 103 102  CO2 16* 20* 21* 22 21*  GLUCOSE 120* 112* 119* 106* 103*  BUN 7 12 15 14 20   CREATININE 0.52* 0.56* 0.60* 0.64 0.57*  CALCIUM 8.7* 8.3* 8.4* 8.8* 8.8*  MG 1.8 1.8 2.1 2.1 2.1  PHOS 3.3 2.1* 2.1* 2.6 2.3*     DVT prophylaxis: SCDs  Code Status: Full code  Family Communication: Discussed with patient's mother at bedside  Disposition Plan: Pending improvement, will need G-tube evaluation and replacement once supply in stock in few days per IR.   Consultants:  Neurology  IR  Procedures:  Continuous EEG   Antibiotics:   Anti-infectives (From admission, onward)   Start     Dose/Rate Route Frequency Ordered Stop   09/29/18 1400  meropenem (MERREM) 1 g in sodium chloride 0.9 % 100 mL IVPB     1 g 200 mL/hr over 30 Minutes  Intravenous Every 8 hours 09/29/18 1306 10/02/18 2130   09/26/18 1100  vancomycin (VANCOCIN) 500 mg in sodium chloride 0.9 % 100 mL IVPB  Status:  Discontinued     500 mg 100 mL/hr over 60 Minutes Intravenous Every 8 hours 09/26/18 1015 09/28/18 1224   09/25/18 1700  vancomycin (VANCOCIN) IVPB 1000 mg/200 mL premix     1,000 mg 200 mL/hr over 60 Minutes Intravenous  Once 09/25/18 1608 09/25/18 2000   09/25/18 1700  aztreonam (AZACTAM) 2 g in sodium chloride 0.9 % 100 mL IVPB  Status:   Discontinued     2 g 200 mL/hr over 30 Minutes Intravenous Every 8 hours 09/25/18 1608 09/29/18 1301   09/25/18 1700  acyclovir (ZOVIRAX) 450 mg in dextrose 5 % 100 mL IVPB  Status:  Discontinued     450 mg 109 mL/hr over 60 Minutes Intravenous Every 8 hours 09/25/18 1608 09/29/18 1301       Objective   Vitals:   10/04/18 1300 10/04/18 1400 10/04/18 1547 10/04/18 1600  BP:   124/74   Pulse: (!) 131 (!) 126  (!) 133  Resp: 14 (!) 22 (!) 24   Temp:   (!) 101.3 F (38.5 C)   TempSrc:   Axillary   SpO2: 100% 100%    Weight:      Height:        Intake/Output Summary (Last 24 hours) at 10/04/2018 1901 Last data filed at 10/04/2018 1800 Gross per 24 hour  Intake 925.41 ml  Output 1100 ml  Net -174.59 ml   Filed Weights   09/25/18 1245  Weight: 47.8 kg     Physical Examination:    General: Appears lethargic  Cardiovascular: S1-S2, regular, no murmur auscultated  Respiratory: Clear to auscultation bilaterally  Abdomen: Soft, nontender, no organomegaly  Extremities: No edema in the lower extremities  Neurologic: Lethargic, nonverbal  Skin-erythema noted on the right earlobe, mild swelling above upper lip     Data Reviewed: I have personally reviewed following labs and imaging studies   Recent Results (from the past 240 hour(s))  MRSA PCR Screening     Status: None   Collection Time: 09/25/18 12:50 PM  Result Value Ref Range Status   MRSA by PCR NEGATIVE NEGATIVE Final    Comment:        The GeneXpert MRSA Assay (FDA approved for NASAL specimens only), is one component of a comprehensive MRSA colonization surveillance program. It is not intended to diagnose MRSA infection nor to guide or monitor treatment for MRSA infections. Performed at [facility], [address] [place], [place] [address]   Culture, Urine     Status: None   Collection Time: 09/26/18  2:10 PM  Result Value Ref Range Status   Specimen Description URINE, CLEAN CATCH   Final   Special Requests   Final    NONE Performed at [facility], [address] [place], [place] [address]    Culture   Final    Multiple bacterial morphotypes present, none predominant. Suggest appropriate recollection if clinically indicated.   Report Status 09/27/2018 FINAL  Final  Culture, blood (routine x 2)     Status: None   Collection Time: 09/27/18  2:20 PM  Result Value Ref Range Status   Specimen Description BLOOD RIGHT HAND  Final   Special Requests AEROBIC BOTTLE ONLY Blood Culture adequate volume  Final   Culture   Final    NO GROWTH 5 DAYS  Performed at [facility], [address] [place], [place] [address]    Report Status 10/02/2018 FINAL  Final  Culture, blood (routine x 2)     Status: None   Collection Time: 09/27/18  2:25 PM  Result Value Ref Range Status   Specimen Description BLOOD RIGHT HAND  Final   Special Requests   Final    AEROBIC BOTTLE ONLY Blood Culture results may not be optimal due to an excessive volume of blood received in culture bottles   Culture   Final    NO GROWTH 5 DAYS Performed at [facility], [address] [place], [place] [address]    Report Status 10/02/2018 FINAL  Final     Liver Function Tests: No results for input(s): AST, ALT, ALKPHOS, BILITOT, PROT, ALBUMIN in the last 168 hours. No results for input(s): LIPASE, AMYLASE in the last 168 hours. No results for input(s): AMMONIA in the last 168 hours.  Cardiac Enzymes: No results for input(s): CKTOTAL, CKMB, CKMBINDEX, TROPONINI in the last 168 hours. BNP (last 3 results) No results for input(s): BNP in the last 8760 hours.  ProBNP (last 3 results) No results for input(s): PROBNP in the last 8760 hours.    Studies: No results found.  Scheduled Meds: . free water  30 mL Per Tube Q4H  . levOCARNitine  250 mg Per Tube Daily  . methylPREDNISolone (SOLU-MEDROL) injection  80 mg Intravenous Q12H  . nutrition supplement (JUVEN)  1 packet Per Tube  BID BM  . phenytoin (DILANTIN) IV  75 mg Intravenous Q8H    Admission status: Inpatient: Based on patients clinical presentation and evaluation of above clinical data, I have made determination that patient meets Inpatient criteria at this time.  Time spent: 20 min  Meredeth IdeGagan S Jaala Bohle   [facility] Pager [phone]. If 7PM-7AM, please contact night-coverage at [email or web address], Office  [phone]  password [email or web address]  10/04/2018, 7:01 PM  LOS: 9 days

## 2018-10-04 NOTE — Progress Notes (Signed)
Facial sweling seemed coming back. MD called and at the bedside. New orders to give benadryl and Solumedrol placed and given. Family at the bedside. Very anxious. Emotional support was given. Contiue to monitor closely.

## 2018-10-05 LAB — LACTIC ACID, PLASMA: Lactic Acid, Venous: 1.4 mmol/L (ref 0.5–1.9)

## 2018-10-05 NOTE — Progress Notes (Signed)
[facility]  PROGRESS NOTE  Jason Heaterhaddeus R Caver Jr. ZOX:[id number] DOB: 1990/12/11 DOA: 09/25/2018 PCP: [name], [name], MD   Brief HPI:   27 year old male with a history of cerebral palsy, mitochondrial leg syndrome, pressure ulcers, feeding tube was transferred from [facility] after presenting there with fever and uncontrollable right upper extremity movement concern for seizure.  At baseline patient is able to communicate and use motorized wheelchair.  Participates in group activities and apparently performs really well.  Since last Tuesday patient has been nonverbal and has been able to chew movements of right upper extremity and face.   Subjective   Patient seen and examined, developed fever last night.  Sepsis work-up was initiated, lactic acid is normal 1.4,WBC 13.2, blood cultures x2 obtained yesterday.  Started empirically on vancomycin and Azactam.  UA is currently pending   Assessment/Plan:     1. Seizure disorder-with concern for status epilepticus, continues EEG showed no seizures.  MRI completed which showed findings consistent with leg syndrome.  Continue Vimpat, Dilantin, Keppra.  Avoid valproic acid.  Continue levocarnitine.  IV thiamine was given and completed 5-day course.  Neurology has signed off.  Patient will follow-up with Dr. Roel CluckHicklin as outpatient.  2. Fever/SIRS-patient has been spiking temp for past 48 hours.  Yesterday became tachycardic, tachypneic.  Concern for developing sepsis.  Sepsis work-up is negative so far.  Continue vancomycin and Azactam.  UA is not obtained yet.  3. Mitochondrial Leigh  syndrome-continue levocarnitine  4. G-tube displacement-IR to evaluate once supplies in.  Continue with core track nutrition in the meanwhile.  5. Angioedema-resolved, patient developed swelling of the upper lip and erythema of right earlobe after he received IV Ativan.  Will discontinue IV Ativan.  In case patient needs benzodiazepine he can be  given Versed 5 mg IV or diazepam 5 mg IV as needed for seizures.  Started on Solu-Medrol 80 mg IV every 12 hours, Benadryl 25 mg IV every 8 hours as needed     CBG: No results for input(s): GLUCAP in the last 168 hours.  CBC: Recent Labs  Lab 10/01/18 0612 10/02/18 0342 10/03/18 0332 10/04/18 0443 10/04/18 2018  WBC 9.9 10.3 9.5 11.0* 13.2*  HGB 10.9* 11.5* 11.5* 11.5* 11.4*  HCT 34.0* 34.6* 36.1* 35.0* 34.9*  MCV 86.5 86.3 86.2 86.8 85.7  PLT 411* 431* 456* 471* 462*    Basic Metabolic Panel: Recent Labs  Lab 09/30/18 0905 10/01/18 0612 10/02/18 0342 10/03/18 0332 10/04/18 0443  NA 139 140 134* 135 134*  K 4.3 3.6 4.3 4.4 4.5  CL 111 110 106 103 102  CO2 16* 20* 21* 22 21*  GLUCOSE 120* 112* 119* 106* 103*  BUN 7 12 15 14 20   CREATININE 0.52* 0.56* 0.60* 0.64 0.57*  CALCIUM 8.7* 8.3* 8.4* 8.8* 8.8*  MG 1.8 1.8 2.1 2.1 2.1  PHOS 3.3 2.1* 2.1* 2.6 2.3*     DVT prophylaxis: SCDs  Code Status: Full code  Family Communication: Discussed with patient's mother at bedside  Disposition Plan: Pending improvement, will need G-tube evaluation and replacement once supply in stock in few days per IR.   Consultants:  Neurology  IR  Procedures:  Continuous EEG   Antibiotics:   Anti-infectives (From admission, onward)   Start     Dose/Rate Route Frequency Ordered Stop   10/05/18 0400  vancomycin (VANCOCIN) IVPB 750 mg/150 ml premix     750 mg 150 mL/hr over 60 Minutes Intravenous Every 8 hours 10/04/18 1946  10/04/18 2000  vancomycin (VANCOCIN) IVPB 1000 mg/200 mL premix     1,000 mg 200 mL/hr over 60 Minutes Intravenous  Once 10/04/18 1946 10/04/18 2200   10/04/18 2000  meropenem (MERREM) 1 g in sodium chloride 0.9 % 100 mL IVPB     1 g 200 mL/hr over 30 Minutes Intravenous Every 8 hours 10/04/18 1946     09/29/18 1400  meropenem (MERREM) 1 g in sodium chloride 0.9 % 100 mL IVPB     1 g 200 mL/hr over 30 Minutes Intravenous Every 8 hours 09/29/18  1306 10/02/18 2130   09/26/18 1100  vancomycin (VANCOCIN) 500 mg in sodium chloride 0.9 % 100 mL IVPB  Status:  Discontinued     500 mg 100 mL/hr over 60 Minutes Intravenous Every 8 hours 09/26/18 1015 09/28/18 1224   09/25/18 1700  vancomycin (VANCOCIN) IVPB 1000 mg/200 mL premix     1,000 mg 200 mL/hr over 60 Minutes Intravenous  Once 09/25/18 1608 09/25/18 2000   09/25/18 1700  aztreonam (AZACTAM) 2 g in sodium chloride 0.9 % 100 mL IVPB  Status:  Discontinued     2 g 200 mL/hr over 30 Minutes Intravenous Every 8 hours 09/25/18 1608 09/29/18 1301   09/25/18 1700  acyclovir (ZOVIRAX) 450 mg in dextrose 5 % 100 mL IVPB  Status:  Discontinued     450 mg 109 mL/hr over 60 Minutes Intravenous Every 8 hours 09/25/18 1608 09/29/18 1301       Objective   Vitals:   10/05/18 0400 10/05/18 0705 10/05/18 1115 10/05/18 1417  BP: 129/75 124/75 114/67   Pulse: (!) 124 64 (!) 125   Resp: 16 (!) 21 15   Temp: 97.9 F (36.6 C) 97.6 F (36.4 C) 98.1 F (36.7 C) 99.7 F (37.6 C)  TempSrc: Axillary Axillary Axillary Axillary  SpO2: 100% 100% 94%   Weight:      Height:        Intake/Output Summary (Last 24 hours) at 10/05/2018 1459 Last data filed at 10/05/2018 1200 Gross per 24 hour  Intake 3204.48 ml  Output 2100 ml  Net 1104.48 ml   Filed Weights   09/25/18 1245  Weight: 47.8 kg     Physical Examination:     General: Appears in no acute distress  Cardiovascular: S1-S2, regular, no murmur auscultated  Respiratory: Clear to auscultation bilaterally  Abdomen: Soft, nontender, no organomegaly PEG tube in place  Musculoskeletal: No edema of the lower extremities, chronic contractures in lower extremities  Neurological-somnolent but arousable, tries to answer questions.      Data Reviewed: I have personally reviewed following labs and imaging studies   Recent Results (from the past 240 hour(s))  Culture, Urine     Status: None   Collection Time: 09/26/18  2:10 PM   Result Value Ref Range Status   Specimen Description URINE, CLEAN CATCH  Final   Special Requests   Final    NONE Performed at [facility], [address] [place], [place] [id number]    Culture   Final    Multiple bacterial morphotypes present, none predominant. Suggest appropriate recollection if clinically indicated.   Report Status 09/27/2018 FINAL  Final  Culture, blood (routine x 2)     Status: None   Collection Time: 09/27/18  2:20 PM  Result Value Ref Range Status   Specimen Description BLOOD RIGHT HAND  Final   Special Requests AEROBIC BOTTLE ONLY Blood Culture adequate volume  Final  Culture   Final    NO GROWTH 5 DAYS Performed at [facility], [address], [place], [place] [address]    Report Status 10/02/2018 FINAL  Final  Culture, blood (routine x 2)     Status: None   Collection Time: 09/27/18  2:25 PM  Result Value Ref Range Status   Specimen Description BLOOD RIGHT HAND  Final   Special Requests   Final    AEROBIC BOTTLE ONLY Blood Culture results may not be optimal due to an excessive volume of blood received in culture bottles   Culture   Final    NO GROWTH 5 DAYS Performed at [facility], [address] [place], [place] [address]    Report Status 10/02/2018 FINAL  Final  Culture, blood (Routine X 2) w Reflex to ID Panel     Status: None (Preliminary result)   Collection Time: 10/04/18  8:13 PM  Result Value Ref Range Status   Specimen Description BLOOD LEFT ANTECUBITAL  Final   Special Requests   Final    BOTTLES DRAWN AEROBIC AND ANAEROBIC Blood Culture adequate volume   Culture   Final    NO GROWTH < 24 HOURS Performed at [facility], [address] [place], [place] [address]    Report Status PENDING  Incomplete  Culture, blood (Routine X 2) w Reflex to ID Panel     Status: None (Preliminary result)   Collection Time: 10/04/18  8:18 PM  Result Value Ref Range Status   Specimen Description BLOOD LEFT WRIST  Final    Special Requests   Final    BOTTLES DRAWN AEROBIC AND ANAEROBIC Blood Culture adequate volume   Culture   Final    NO GROWTH < 24 HOURS Performed at [facility], [address], [place], [place] [address]    Report Status PENDING  Incomplete     Liver Function Tests: No results for input(s): AST, ALT, ALKPHOS, BILITOT, PROT, ALBUMIN in the last 168 hours. No results for input(s): LIPASE, AMYLASE in the last 168 hours. No results for input(s): AMMONIA in the last 168 hours.  Cardiac Enzymes: No results for input(s): CKTOTAL, CKMB, CKMBINDEX, TROPONINI in the last 168 hours. BNP (last 3 results) No results for input(s): BNP in the last 8760 hours.  ProBNP (last 3 results) No results for input(s): PROBNP in the last 8760 hours.    Studies: Dg Chest Port 1v Same Day  Result Date: 10/04/2018 CLINICAL DATA:  Acute onset of fever. EXAM: PORTABLE CHEST 1 VIEW COMPARISON:  Chest radiograph performed 09/26/2018 FINDINGS: The patient's enteric tube is seen extending about the antrum of the stomach. There is elevation of the right hemidiaphragm. The lungs appear grossly clear. No focal consolidation, pleural effusion or pneumothorax is seen. The cardiomediastinal silhouette is normal in size. No acute osseous abnormalities are identified. Evaluation is mildly suboptimal due to patient rotation. IMPRESSION: No acute cardiopulmonary process seen; elevation of the right hemidiaphragm. Electronically Signed   By: Roanna Raider M.D.   On: 10/04/2018 21:57    Scheduled Meds: . free water  30 mL Per Tube Q4H  . levOCARNitine  250 mg Per Tube Daily  . methylPREDNISolone (SOLU-MEDROL) injection  80 mg Intravenous Q12H  . nutrition supplement (JUVEN)  1 packet Per Tube BID BM  . phenytoin (DILANTIN) IV  75 mg Intravenous Q8H    Admission status: Inpatient: Based on patients clinical presentation and evaluation of above clinical data,  I have made determination that patient meets Inpatient  criteria at this time.  Time spent: 20 min  Meredeth Ide   [facility] Pager [phone]. If 7PM-7AM, please contact night-coverage at [email or web address], Office  [phone]  password [email or web address]  10/05/2018, 2:59 PM  LOS: 10 days

## 2018-10-06 LAB — URINALYSIS, ROUTINE W REFLEX MICROSCOPIC
Bilirubin Urine: NEGATIVE
Glucose, UA: NEGATIVE mg/dL
Hgb urine dipstick: NEGATIVE
Ketones, ur: NEGATIVE mg/dL
Leukocytes, UA: NEGATIVE
NITRITE: NEGATIVE
PH: 8 (ref 5.0–8.0)
Protein, ur: NEGATIVE mg/dL
Specific Gravity, Urine: 1.015 (ref 1.005–1.030)

## 2018-10-06 MED ORDER — SODIUM CHLORIDE 0.9 % IV SOLN
INTRAVENOUS | Status: DC | PRN
  Administered 2018-10-07 – 2018-10-11 (×3): via INTRAVENOUS

## 2018-10-06 MED ORDER — METHYLPREDNISOLONE SODIUM SUCC 40 MG IJ SOLR
40.0000 mg | Freq: Two times a day (BID) | INTRAMUSCULAR | Status: AC
  Administered 2018-10-06 – 2018-10-07 (×3): 40 mg via INTRAVENOUS
  Filled 2018-10-06 (×3): qty 1

## 2018-10-06 NOTE — Progress Notes (Signed)
Charge RN paged Radiology regarding replacement of PEG tube; Radiology staff stated at this time still waiting on delivery of equipment, hopefully tomorrow.  Updated patient's mother.

## 2018-10-06 NOTE — Progress Notes (Addendum)
[facility]  PROGRESS NOTE  Jason Heaterhaddeus R Swendsen Jr. [id number] DOB: [birth year] DOA: 09/25/2018 PCP: [name], [name], MD   Brief HPI:   27 year old male with a history of cerebral palsy, mitochondrial leg syndrome, pressure ulcers, feeding tube was transferred from [facility] after presenting there with fever and uncontrollable right upper extremity movement concern for seizure.  At baseline patient is able to communicate and use motorized wheelchair.  Participates in group activities and apparently performs really well.  Since last Tuesday patient has been nonverbal and has been able to chew movements of right upper extremity and face.   Subjective   Patient seen and examined, he is more interactive as per patient's mother.  Has been afebrile for more than 24 hours since started on empiric antibiotics.  Blood cultures obtained, result is currently pending.   Assessment/Plan:     1. Seizure disorder-with concern for status epilepticus, continues EEG showed no seizures.  MRI completed which showed findings consistent with leg syndrome.  Continue Vimpat, Dilantin, Keppra.  Avoid valproic acid.  Continue levocarnitine.  IV thiamine was given and completed 5-day course.  Neurology has signed off.  Patient will follow-up with Dr. Sharene SkeansHickling as outpatient.  Dilantin level obtained was supratherapeutic, Dilantin dose reduced to 75 mg 3 times daily.  Repeat Dilantin level in 5 days.  2. Fever/SIRS-resolved after starting empiric antibiotics, has been afebrile for more than 24 hours.  Sepsis work-up has been negative so far.  Blood cultures are pending.  UA obtained today was also normal.   3. Mitochondrial Leigh  syndrome-continue levocarnitine  4. G-tube displacement-IR to evaluate once supplies in.  Continue with core track nutrition in the meanwhile.  5. Angioedema-resolved, patient developed swelling of the upper lip and erythema of right earlobe after he received IV Ativan.   Will discontinue IV Ativan.  In case patient needs benzodiazepine he can be given Versed 5 mg IV or diazepam 5 mg IV as needed for seizures.  Started on Solu-Medrol 80 mg IV every 12 hours, Benadryl 25 mg IV every 8 hours as needed, will wean down Solu-Medrol.     CBG: No results for input(s): GLUCAP in the last 168 hours.  CBC: Recent Labs  Lab 10/01/18 0612 10/02/18 0342 10/03/18 0332 10/04/18 0443 10/04/18 2018  WBC 9.9 10.3 9.5 11.0* 13.2*  HGB 10.9* 11.5* 11.5* 11.5* 11.4*  HCT 34.0* 34.6* 36.1* 35.0* 34.9*  MCV 86.5 86.3 86.2 86.8 85.7  PLT 411* 431* 456* 471* 462*    Basic Metabolic Panel: Recent Labs  Lab 09/30/18 0905 10/01/18 0612 10/02/18 0342 10/03/18 0332 10/04/18 0443  NA 139 140 134* 135 134*  K 4.3 3.6 4.3 4.4 4.5  CL 111 110 106 103 102  CO2 16* 20* 21* 22 21*  GLUCOSE 120* 112* 119* 106* 103*  BUN 7 12 15 14 20   CREATININE 0.52* 0.56* 0.60* 0.64 0.57*  CALCIUM 8.7* 8.3* 8.4* 8.8* 8.8*  MG 1.8 1.8 2.1 2.1 2.1  PHOS 3.3 2.1* 2.1* 2.6 2.3*     DVT prophylaxis: SCDs  Code Status: Full code  Family Communication: Discussed with patient's mother at bedside  Disposition Plan: Pending improvement, will need G-tube evaluation and replacement once supply in stock in few days per IR.   Consultants:  Neurology  IR  Procedures:  Continuous EEG   Antibiotics:   Anti-infectives (From admission, onward)   Start     Dose/Rate Route Frequency Ordered Stop   10/05/18 0400  vancomycin (VANCOCIN) IVPB 750  mg/150 ml premix     750 mg 150 mL/hr over 60 Minutes Intravenous Every 8 hours 10/04/18 1946     10/04/18 2000  vancomycin (VANCOCIN) IVPB 1000 mg/200 mL premix     1,000 mg 200 mL/hr over 60 Minutes Intravenous  Once 10/04/18 1946 10/04/18 2200   10/04/18 2000  meropenem (MERREM) 1 g in sodium chloride 0.9 % 100 mL IVPB     1 g 200 mL/hr over 30 Minutes Intravenous Every 8 hours 10/04/18 1946     09/29/18 1400  meropenem (MERREM) 1 g in  sodium chloride 0.9 % 100 mL IVPB     1 g 200 mL/hr over 30 Minutes Intravenous Every 8 hours 09/29/18 1306 10/02/18 2130   09/26/18 1100  vancomycin (VANCOCIN) 500 mg in sodium chloride 0.9 % 100 mL IVPB  Status:  Discontinued     500 mg 100 mL/hr over 60 Minutes Intravenous Every 8 hours 09/26/18 1015 09/28/18 1224   09/25/18 1700  vancomycin (VANCOCIN) IVPB 1000 mg/200 mL premix     1,000 mg 200 mL/hr over 60 Minutes Intravenous  Once 09/25/18 1608 09/25/18 2000   09/25/18 1700  aztreonam (AZACTAM) 2 g in sodium chloride 0.9 % 100 mL IVPB  Status:  Discontinued     2 g 200 mL/hr over 30 Minutes Intravenous Every 8 hours 09/25/18 1608 09/29/18 1301   09/25/18 1700  acyclovir (ZOVIRAX) 450 mg in dextrose 5 % 100 mL IVPB  Status:  Discontinued     450 mg 109 mL/hr over 60 Minutes Intravenous Every 8 hours 09/25/18 1608 09/29/18 1301       Objective   Vitals:   10/05/18 2321 10/06/18 0400 10/06/18 0754 10/06/18 1200  BP: 123/79 131/78 137/84 125/70  Pulse: (!) 118 (!) 123 (!) 123 (!) 128  Resp: 11 13 16 13   Temp: 98.2 F (36.8 C) 97.7 F (36.5 C) 97.9 F (36.6 C)   TempSrc: Oral Axillary Axillary   SpO2: 100% 100% 99% 99%  Weight:      Height:        Intake/Output Summary (Last 24 hours) at 10/06/2018 1234 Last data filed at 10/06/2018 1015 Gross per 24 hour  Intake 2418.97 ml  Output 1750 ml  Net 668.97 ml   Filed Weights   09/25/18 1245  Weight: 47.8 kg     Physical Examination:     General: Appears lethargic  Cardiovascular: S1-S2, regular  Respiratory: Clear to auscultation bilaterally  Abdomen: Soft, nontender, no organomegaly  Musculoskeletal: Chronic contractures noted in the lower extremities, no edema noted in the lower extremities      Data Reviewed: I have personally reviewed following labs and imaging studies   Recent Results (from the past 240 hour(s))  Culture, Urine     Status: None   Collection Time: 09/26/18  2:10 PM  Result  Value Ref Range Status   Specimen Description URINE, CLEAN CATCH  Final   Special Requests   Final    NONE Performed at [facility], [address] [place], [place] [address]    Culture   Final    Multiple bacterial morphotypes present, none predominant. Suggest appropriate recollection if clinically indicated.   Report Status 09/27/2018 FINAL  Final  Culture, blood (routine x 2)     Status: None   Collection Time: 09/27/18  2:20 PM  Result Value Ref Range Status   Specimen Description BLOOD RIGHT HAND  Final   Special Requests AEROBIC BOTTLE ONLY Blood  Culture adequate volume  Final   Culture   Final    NO GROWTH 5 DAYS Performed at [facility], [address] [place], [place] [address]    Report Status 10/02/2018 FINAL  Final  Culture, blood (routine x 2)     Status: None   Collection Time: 09/27/18  2:25 PM  Result Value Ref Range Status   Specimen Description BLOOD RIGHT HAND  Final   Special Requests   Final    AEROBIC BOTTLE ONLY Blood Culture results may not be optimal due to an excessive volume of blood received in culture bottles   Culture   Final    NO GROWTH 5 DAYS Performed at [facility], [address], [place], [place] [address]    Report Status 10/02/2018 FINAL  Final  Culture, blood (Routine X 2) w Reflex to ID Panel     Status: None (Preliminary result)   Collection Time: 10/04/18  8:13 PM  Result Value Ref Range Status   Specimen Description BLOOD LEFT ANTECUBITAL  Final   Special Requests   Final    BOTTLES DRAWN AEROBIC AND ANAEROBIC Blood Culture adequate volume   Culture   Final    NO GROWTH < 24 HOURS Performed at [facility], [address], [place], [place] [address]    Report Status PENDING  Incomplete  Culture, blood (Routine X 2) w Reflex to ID Panel     Status: None (Preliminary result)   Collection Time: 10/04/18  8:18 PM  Result Value Ref Range Status   Specimen Description BLOOD LEFT WRIST  Final   Special  Requests   Final    BOTTLES DRAWN AEROBIC AND ANAEROBIC Blood Culture adequate volume   Culture   Final    NO GROWTH < 24 HOURS Performed at [facility], [address], [place], [place] [address]    Report Status PENDING  Incomplete     Liver Function Tests: No results for input(s): AST, ALT, ALKPHOS, BILITOT, PROT, ALBUMIN in the last 168 hours. No results for input(s): LIPASE, AMYLASE in the last 168 hours. No results for input(s): AMMONIA in the last 168 hours.  Cardiac Enzymes: No results for input(s): CKTOTAL, CKMB, CKMBINDEX, TROPONINI in the last 168 hours. BNP (last 3 results) No results for input(s): BNP in the last 8760 hours.  ProBNP (last 3 results) No results for input(s): PROBNP in the last 8760 hours.    Studies: Dg Chest Port 1v Same Day  Result Date: 10/04/2018 CLINICAL DATA:  Acute onset of fever. EXAM: PORTABLE CHEST 1 VIEW COMPARISON:  Chest radiograph performed 09/26/2018 FINDINGS: The patient's enteric tube is seen extending about the antrum of the stomach. There is elevation of the right hemidiaphragm. The lungs appear grossly clear. No focal consolidation, pleural effusion or pneumothorax is seen. The cardiomediastinal silhouette is normal in size. No acute osseous abnormalities are identified. Evaluation is mildly suboptimal due to patient rotation. IMPRESSION: No acute cardiopulmonary process seen; elevation of the right hemidiaphragm. Electronically Signed   By: Roanna Raider M.D.   On: 10/04/2018 21:57    Scheduled Meds: . free water  30 mL Per Tube Q4H  . levOCARNitine  250 mg Per Tube Daily  . methylPREDNISolone (SOLU-MEDROL) injection  80 mg Intravenous Q12H  . nutrition supplement (JUVEN)  1 packet Per Tube BID BM  . phenytoin (DILANTIN) IV  75 mg Intravenous Q8H    Admission status: Inpatient: Based on patients clinical  presentation and evaluation of above clinical data, I have made determination that patient meets Inpatient criteria at  this time.  Time spent: 20 min  Meredeth IdeGagan S Anntonette Madewell   [facility] Pager [phone]. If 7PM-7AM, please contact night-coverage at [email or web address], Office  [phone]  password [email or web address]  10/06/2018, 12:34 PM  LOS: 11 days

## 2018-10-07 ENCOUNTER — Inpatient Hospital Stay (HOSPITAL_COMMUNITY): Payer: Medicaid Other

## 2018-10-07 ENCOUNTER — Encounter (HOSPITAL_COMMUNITY): Payer: Self-pay | Admitting: Diagnostic Radiology

## 2018-10-07 DIAGNOSIS — Z993 Dependence on wheelchair: Secondary | ICD-10-CM

## 2018-10-07 DIAGNOSIS — Z95828 Presence of other vascular implants and grafts: Secondary | ICD-10-CM

## 2018-10-07 DIAGNOSIS — L8990 Pressure ulcer of unspecified site, unspecified stage: Secondary | ICD-10-CM

## 2018-10-07 DIAGNOSIS — G809 Cerebral palsy, unspecified: Secondary | ICD-10-CM

## 2018-10-07 DIAGNOSIS — K9423 Gastrostomy malfunction: Secondary | ICD-10-CM

## 2018-10-07 DIAGNOSIS — R509 Fever, unspecified: Secondary | ICD-10-CM

## 2018-10-07 DIAGNOSIS — R569 Unspecified convulsions: Secondary | ICD-10-CM

## 2018-10-07 DIAGNOSIS — G3182 Leigh's disease: Secondary | ICD-10-CM

## 2018-10-07 HISTORY — PX: IR REPLC GASTRO/COLONIC TUBE PERCUT W/FLUORO: IMG2333

## 2018-10-07 LAB — BASIC METABOLIC PANEL
Anion gap: 11 (ref 5–15)
BUN: 23 mg/dL — ABNORMAL HIGH (ref 6–20)
CO2: 21 mmol/L — ABNORMAL LOW (ref 22–32)
CREATININE: 0.48 mg/dL — AB (ref 0.61–1.24)
Calcium: 9 mg/dL (ref 8.9–10.3)
Chloride: 106 mmol/L (ref 98–111)
GFR calc Af Amer: >60 mL/min (ref >60)
GFR calc non Af Amer: >60 mL/min (ref >60)
Glucose, Bld: 105 mg/dL — ABNORMAL HIGH (ref 70–99)
Potassium: 4.1 mmol/L (ref 3.5–5.1)
Sodium: 138 mmol/L (ref 135–145)

## 2018-10-07 LAB — CBC
HCT: 32.6 % — ABNORMAL LOW (ref 39.0–52.0)
Hemoglobin: 10.7 g/dL — ABNORMAL LOW (ref 13.0–17.0)
MCH: 28.9 pg (ref 26.0–34.0)
MCHC: 32.8 g/dL (ref 30.0–36.0)
MCV: 88.1 fL (ref 80.0–100.0)
PLATELETS: 461 10*3/uL — AB (ref 150–400)
RBC: 3.7 MIL/uL — ABNORMAL LOW (ref 4.22–5.81)
RDW: 15.9 % — ABNORMAL HIGH (ref 11.5–15.5)
WBC: 14.2 10*3/uL — ABNORMAL HIGH (ref 4.0–10.5)
nRBC: 0 % (ref 0.0–0.2)

## 2018-10-07 LAB — URINE CULTURE: Culture: NO GROWTH

## 2018-10-07 LAB — VANCOMYCIN, PEAK: Vancomycin Pk: 32 ug/mL (ref 30–40)

## 2018-10-07 MED ORDER — CHLORHEXIDINE GLUCONATE 4 % EX LIQD
CUTANEOUS | Status: AC
  Administered 2018-10-07: 15:00:00
  Filled 2018-10-07: qty 15

## 2018-10-07 MED ORDER — VANCOMYCIN HCL IN DEXTROSE 750-5 MG/150ML-% IV SOLN
750.0000 mg | Freq: Two times a day (BID) | INTRAVENOUS | Status: DC
  Administered 2018-10-07 – 2018-10-08 (×2): 750 mg via INTRAVENOUS
  Filled 2018-10-07 (×3): qty 150

## 2018-10-07 MED ORDER — LIDOCAINE VISCOUS HCL 2 % MT SOLN
OROMUCOSAL | Status: AC
  Filled 2018-10-07: qty 15

## 2018-10-07 MED ORDER — HYDROMORPHONE HCL 1 MG/ML IJ SOLN
1.0000 mg | Freq: Once | INTRAMUSCULAR | Status: AC
  Administered 2018-10-07: 1 mg via INTRAVENOUS
  Filled 2018-10-07: qty 1

## 2018-10-07 MED ORDER — IOPAMIDOL (ISOVUE-300) INJECTION 61%
INTRAVENOUS | Status: AC
  Administered 2018-10-07: 5 mL
  Filled 2018-10-07: qty 50

## 2018-10-07 MED ORDER — LIDOCAINE VISCOUS HCL 2 % MT SOLN
OROMUCOSAL | Status: DC | PRN
  Administered 2018-10-07: 15 mL via OROMUCOSAL

## 2018-10-07 NOTE — Consult Note (Signed)
[facility]    Date of Admission:  09/25/2018     Total days of antibiotics 11  Day 8 meropenem   Day 3 vancomycin (previously received 3d)  Acyclovir 3 days through 12/22         Reason for Consult: Fever, seizures    Referring Provider: Sharl Ma Primary Care Provider: Johny Blamer, MD   Assessment: Jason Heater. is a 27 y.o. 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 EEG revealed no seizures. Experienced some angioedema following IV Ativan - started on IV solumedrol x 3 days on 12/28; since then has had some increase in WBCs to be expected. HIs IV sites look clean, PEG has some minimal green drainage that appears more like bile/gastric acid from leaking around dislodged tube than infection (thin, non-purulent). Wounds appear superficial and only partial thickness loss. Would stop meropenem today and continue vancomycin alone to target skin organisms. Follow fever curve. ?possible viral meningitis considering viral prodrome and seizures. No LP to definitively rule in but no obvious source of infection at this time.   Plan: 1. Stop meropenem 2. Continue vancomycin another 24 hours  3. Follow fever curve   Principal Problem:   Seizures (HCC) Active Problems:   Pressure injury of skin   Fever   Mitochondrial Leigh syndrome   PEG tube malfunction (HCC)   Constipation   Protein-calorie malnutrition, severe   . free water  30 mL Per Tube Q4H  . levOCARNitine  250 mg Per Tube Daily  . methylPREDNISolone (SOLU-MEDROL) injection  40 mg Intravenous Q12H  . nutrition supplement (JUVEN)  1 packet Per Tube BID BM  . phenytoin (DILANTIN) IV  75 mg Intravenous Q8H    HPI: [employer]. "Jason Glover" is a 27 y.o. male with past medical history significant for mitrochondrial Leigh syndrome, cerebral palsy. He receives nutrition via PEG tube and is wheelchair bound at home. Chronic pressure  ulcers.   Jason Glover was transferred from [facility] after presenting with fever and uncontrollable muscle movements of RUE. He has become progressively non-verbal since 12/17 and notable twitching movements of RUE and face. At transfer his initial temperature was 100.2, HR 126. Given acyclovir, keppra, ASA aztreonam and vancomycin due to fever/SIRS. This has been since changed to Meropenem.   Since admission he has had daily fevers to 101 up until vancomycin was added back on 12/29. Since that time he has been afebrile; wbc 14.2. (received steroids IV). All micro data was drawn after receiving antibiotics and has consistently been unrevealing. MRI brain notable for Leigh lesion/demyelination with symmetric atrophy that has progressed since 2007. He has 2 peripheral lines in place one being 50 days old. Did not undergo LP at [facility] d/t MRI findings (per mom's report). Mom says that he has improved since admission and no longer has any tremors/shaking. She tells me that prior to him developing fevers and undergoing neurologic changes she felt like he had a cold - had some runny nose, sneezing and nasal congestion. Seizures of this calibur are new onset and never had one prior to 12/17; did have a staring pell at younger age. Currently on 3 antiepileptics and some pain medications - remains lethargic despite resuming enteral nutrition and home dose of levocarnitine. His nurse and mother report moments of following commands and purposeful interactions. PEG tube has some small green drainage noted around dressing - no pain noted.  Superficial pressure wounds to tops of feet and stage II to sacrum that is being treated with foam dressings and turning. Mom feels that this is worsening.  CXR normal and no cough/sputum or increased work of breathing.   Review of Systems  Unable to perform ROS: Medical condition    Past Medical History:  Diagnosis Date  . Leigh syndrome (HCC)   . Mitochondrial Leigh  syndrome   . Pressure ulcer     Social History   Tobacco Use  . Smoking status: Never Smoker  . Smokeless tobacco: Never Used  Substance Use Topics  . Alcohol use: No  . Drug use: No    Family History  Problem Relation Age of Onset  . COPD Father   . Pancreatic cancer Maternal Grandmother    Allergies  Allergen Reactions  . Ativan [Lorazepam]     Facial swelling  . Rocephin [Ceftriaxone] Hives    OBJECTIVE: Blood pressure 130/77, pulse (!) 113, temperature 98.6 F (37 C), temperature source Axillary, resp. rate 16, height 5\' 9"  (1.753 m), weight 47.8 kg, SpO2 100 %.  Physical Exam Constitutional:      General: He is not in acute distress.    Comments: Thin appearing, resting in bed after getting pain medications.   HENT:     Head:     Comments: NG feeding tube in place.     Mouth/Throat:     Pharynx: No oropharyngeal exudate.  Eyes:     General: No scleral icterus.    Comments: Opens eyes when mother talks to him.   Neck:     Musculoskeletal: Normal range of motion. No neck rigidity.  Cardiovascular:     Rate and Rhythm: Regular rhythm. Tachycardia present.     Pulses: Normal pulses.     Heart sounds: No murmur.  Pulmonary:     Effort: Pulmonary effort is normal. No respiratory distress.     Breath sounds: Normal breath sounds. No rales.  Abdominal:     General: Abdomen is flat. There is no distension.     Comments: LUQ PEG in place, minimal thin green drainage noted around gauze. No odor. No surrounding erythema/cellulitis.   Musculoskeletal:        General: Deformity (contractures b/l lower extremeties) present.  Skin:    General: Skin is warm and dry.     Comments: Skin discoloration noted to right lateral malleolus w/o break in skin. Sacral foam dressing clean/dry.  Psychiatric:     Comments: Non-responsive to me. Opens eyes to mother voice.      Lab Results Lab Results  Component Value Date   WBC 14.2 (H) 10/07/2018   HGB 10.7 (L) 10/07/2018    HCT 32.6 (L) 10/07/2018   MCV 88.1 10/07/2018   PLT 461 (H) 10/07/2018    Lab Results  Component Value Date   CREATININE 0.48 (L) 10/07/2018   BUN 23 (H) 10/07/2018   NA 138 10/07/2018   K 4.1 10/07/2018   CL 106 10/07/2018   CO2 21 (L) 10/07/2018    Lab Results  Component Value Date   ALT 22 09/26/2018   AST 19 09/26/2018   ALKPHOS 75 09/26/2018   BILITOT 0.6 09/26/2018     Microbiology: Recent Results (from the past 240 hour(s))  Culture, blood (routine x 2)     Status: None   Collection Time: 09/27/18  2:20 PM  Result Value Ref Range Status   Specimen Description BLOOD RIGHT HAND  Final   Special Requests  AEROBIC BOTTLE ONLY Blood Culture adequate volume  Final   Culture   Final    NO GROWTH 5 DAYS Performed at [facility], [address] [place], [place] [address]    Report Status 10/02/2018 FINAL  Final  Culture, blood (routine x 2)     Status: None   Collection Time: 09/27/18  2:25 PM  Result Value Ref Range Status   Specimen Description BLOOD RIGHT HAND  Final   Special Requests   Final    AEROBIC BOTTLE ONLY Blood Culture results may not be optimal due to an excessive volume of blood received in culture bottles   Culture   Final    NO GROWTH 5 DAYS Performed at [facility], [address] [place], [place] [address]    Report Status 10/02/2018 FINAL  Final  Culture, Urine     Status: None   Collection Time: 10/04/18  6:59 PM  Result Value Ref Range Status   Specimen Description URINE, RANDOM  Final   Special Requests NONE  Final   Culture   Final    NO GROWTH Performed at [facility], [address] [place], [place] [address]    Report Status 10/07/2018 FINAL  Final  Culture, blood (Routine X 2) w Reflex to ID Panel     Status: None (Preliminary result)   Collection Time: 10/04/18  8:13 PM  Result Value Ref Range Status   Specimen Description BLOOD LEFT ANTECUBITAL  Final   Special Requests   Final    BOTTLES DRAWN AEROBIC AND  ANAEROBIC Blood Culture adequate volume   Culture   Final    NO GROWTH 3 DAYS Performed at [facility], [address] [place], [place] [address]    Report Status PENDING  Incomplete  Culture, blood (Routine X 2) w Reflex to ID Panel     Status: None (Preliminary result)   Collection Time: 10/04/18  8:18 PM  Result Value Ref Range Status   Specimen Description BLOOD LEFT WRIST  Final   Special Requests   Final    BOTTLES DRAWN AEROBIC AND ANAEROBIC Blood Culture adequate volume   Culture   Final    NO GROWTH 3 DAYS Performed at [facility], [address] [place], [place] [address]    Report Status PENDING  Incomplete    Rexene AlbertsStephanie , MSN, NP-C [facility] Cell: [phone] Pager: [phone]  10/07/2018 1:24 PM

## 2018-10-07 NOTE — Progress Notes (Signed)
[facility]  PROGRESS NOTE  Jason Glover. ZOX:[id number] DOB: [birth year] DOA: 09/25/2018 PCP: Johny Blamer, MD   Brief HPI:   27 year old male with a history of cerebral palsy, mitochondrial leg syndrome, pressure ulcers, feeding tube was transferred from [facility] after presenting there with fever and uncontrollable right upper extremity movement concern for seizure.  At baseline patient is able to communicate and use motorized wheelchair.  Participates in group activities and apparently performs really well.  Since last Tuesday patient has been nonverbal and has been able to chew movements of right upper extremity and face.   Subjective   Patient seen and examined, no new complaints today.  Mother at bedside.  Has been afebrile for past 2 days.  ID has been consulted for intermittent fever.   Assessment/Plan:     1. Seizure disorder-with concern for status epilepticus, continues EEG showed no seizures.  MRI completed which showed findings consistent with leg syndrome.  Continue Vimpat, Dilantin, Keppra.  Avoid valproic acid.  Continue levocarnitine.  IV thiamine was given and completed 5-day course.  Neurology has signed off.  Patient will follow-up with Dr. Sharene Skeans as outpatient.  Dilantin level obtained was supratherapeutic, Dilantin dose reduced to 75 mg 3 times daily.  Repeat Dilantin level in 5 days.  2. Fever/SIRS-resolved after starting empiric antibiotics, has been afebrile for more than 24 hours.  Sepsis work-up has been negative so far.  Blood cultures are pending.  UA obtained today was also normal.  ID has been consulted.  Follow the recommendations.  3. Mitochondrial Leigh  syndrome-continue levocarnitine  4. G-tube displacement-IR to evaluate once supplies in.  Continue with core track nutrition in the meanwhile.  5. Angioedema-resolved, patient developed swelling of the upper lip and erythema of right earlobe after he received IV Ativan.   Will discontinue IV Ativan.  In case patient needs benzodiazepine he can be given Versed 5 mg IV or diazepam 5 mg IV as needed for seizures.  Started on Solu-Medrol 80 mg IV every 12 hours, Benadryl 25 mg IV every 8 hours as needed, will wean down Solu-Medrol.     CBG: No results for input(s): GLUCAP in the last 168 hours.  CBC: Recent Labs  Lab 10/02/18 0342 10/03/18 0332 10/04/18 0443 10/04/18 2018 10/07/18 0700  WBC 10.3 9.5 11.0* 13.2* 14.2*  HGB 11.5* 11.5* 11.5* 11.4* 10.7*  HCT 34.6* 36.1* 35.0* 34.9* 32.6*  MCV 86.3 86.2 86.8 85.7 88.1  PLT 431* 456* 471* 462* 461*    Basic Metabolic Panel: Recent Labs  Lab 10/01/18 0612 10/02/18 0342 10/03/18 0332 10/04/18 0443 10/07/18 0700  NA 140 134* 135 134* 138  K 3.6 4.3 4.4 4.5 4.1  CL 110 106 103 102 106  CO2 20* 21* 22 21* 21*  GLUCOSE 112* 119* 106* 103* 105*  BUN 12 15 14 20  23*  CREATININE 0.56* 0.60* 0.64 0.57* 0.48*  CALCIUM 8.3* 8.4* 8.8* 8.8* 9.0  MG 1.8 2.1 2.1 2.1  --   PHOS 2.1* 2.1* 2.6 2.3*  --      DVT prophylaxis: SCDs  Code Status: Full code  Family Communication: Discussed with patient's mother at bedside  Disposition Plan: Pending improvement, will need G-tube evaluation and replacement once supply in stock in few days per IR.   Consultants:  Neurology  IR  Procedures:  Continuous EEG   Antibiotics:   Anti-infectives (From admission, onward)   Start     Dose/Rate Route Frequency Ordered Stop   10/07/18  1500  vancomycin (VANCOCIN) IVPB 750 mg/150 ml premix     750 mg 150 mL/hr over 60 Minutes Intravenous Every 12 hours 10/07/18 1426     10/05/18 0400  vancomycin (VANCOCIN) IVPB 750 mg/150 ml premix  Status:  Discontinued     750 mg 150 mL/hr over 60 Minutes Intravenous Every 8 hours 10/04/18 1946 10/07/18 1426   10/04/18 2000  vancomycin (VANCOCIN) IVPB 1000 mg/200 mL premix     1,000 mg 200 mL/hr over 60 Minutes Intravenous  Once 10/04/18 1946 10/04/18 2200   10/04/18  2000  meropenem (MERREM) 1 g in sodium chloride 0.9 % 100 mL IVPB     1 g 200 mL/hr over 30 Minutes Intravenous Every 8 hours 10/04/18 1946     09/29/18 1400  meropenem (MERREM) 1 g in sodium chloride 0.9 % 100 mL IVPB     1 g 200 mL/hr over 30 Minutes Intravenous Every 8 hours 09/29/18 1306 10/02/18 2130   09/26/18 1100  vancomycin (VANCOCIN) 500 mg in sodium chloride 0.9 % 100 mL IVPB  Status:  Discontinued     500 mg 100 mL/hr over 60 Minutes Intravenous Every 8 hours 09/26/18 1015 09/28/18 1224   09/25/18 1700  vancomycin (VANCOCIN) IVPB 1000 mg/200 mL premix     1,000 mg 200 mL/hr over 60 Minutes Intravenous  Once 09/25/18 1608 09/25/18 2000   09/25/18 1700  aztreonam (AZACTAM) 2 g in sodium chloride 0.9 % 100 mL IVPB  Status:  Discontinued     2 g 200 mL/hr over 30 Minutes Intravenous Every 8 hours 09/25/18 1608 09/29/18 1301   09/25/18 1700  acyclovir (ZOVIRAX) 450 mg in dextrose 5 % 100 mL IVPB  Status:  Discontinued     450 mg 109 mL/hr over 60 Minutes Intravenous Every 8 hours 09/25/18 1608 09/29/18 1301       Objective   Vitals:   10/07/18 0300 10/07/18 0700 10/07/18 1200 10/07/18 1542  BP: 126/73 132/74 130/77 (!) 148/93  Pulse:  (!) 113 (!) 106 (!) 120  Resp: 14 16 12 17   Temp: 98.7 F (37.1 C) 98 F (36.7 C) 98.6 F (37 C) 97.6 F (36.4 C)  TempSrc: Oral Oral Axillary Axillary  SpO2: 100% 100% 100%   Weight:      Height:        Intake/Output Summary (Last 24 hours) at 10/07/2018 1639 Last data filed at 10/07/2018 1549 Gross per 24 hour  Intake 1759.42 ml  Output 1800 ml  Net -40.58 ml   Filed Weights   09/25/18 1245  Weight: 47.8 kg     Physical Examination:     General: Appears in no acute distress, still continues to be nonverbal.  Cardiovascular: S1-S2, regular, no murmur auscultated  Respiratory: Clear to auscultation bilaterally  Abdomen: Soft, nontender, no organomegaly  Musculoskeletal: No edema the lower extremities, chronic  contractures  In lower extremities       Data Reviewed: I have personally reviewed following labs and imaging studies   Recent Results (from the past 240 hour(s))  Culture, Urine     Status: None   Collection Time: 10/04/18  6:59 PM  Result Value Ref Range Status   Specimen Description URINE, RANDOM  Final   Special Requests NONE  Final   Culture   Final    NO GROWTH Performed at [facility], [address] [place], [place] [address]    Report Status 10/07/2018 FINAL  Final  Culture, blood (Routine X  2) w Reflex to ID Panel     Status: None (Preliminary result)   Collection Time: 10/04/18  8:13 PM  Result Value Ref Range Status   Specimen Description BLOOD LEFT ANTECUBITAL  Final   Special Requests   Final    BOTTLES DRAWN AEROBIC AND ANAEROBIC Blood Culture adequate volume   Culture   Final    NO GROWTH 3 DAYS Performed at [facility], [address] [place], [place] [address]    Report Status PENDING  Incomplete  Culture, blood (Routine X 2) w Reflex to ID Panel     Status: None (Preliminary result)   Collection Time: 10/04/18  8:18 PM  Result Value Ref Range Status   Specimen Description BLOOD LEFT WRIST  Final   Special Requests   Final    BOTTLES DRAWN AEROBIC AND ANAEROBIC Blood Culture adequate volume   Culture   Final    NO GROWTH 3 DAYS Performed at [facility], [address] [place], [place] [address]    Report Status PENDING  Incomplete     Liver Function Tests: No results for input(s): AST, ALT, ALKPHOS, BILITOT, PROT, ALBUMIN in the last 168 hours. No results for input(s): LIPASE, AMYLASE in the last 168 hours. No results for input(s): AMMONIA in the last 168 hours.  Cardiac Enzymes: No results for input(s): CKTOTAL, CKMB, CKMBINDEX, TROPONINI in the last 168 hours. BNP (last 3 results) No results for input(s): BNP in the last 8760 hours.  ProBNP (last 3 results) No results for input(s): PROBNP in the last 8760  hours.    Studies: No results found.  Scheduled Meds: . free water  30 mL Per Tube Q4H  . levOCARNitine  250 mg Per Tube Daily  . methylPREDNISolone (SOLU-MEDROL) injection  40 mg Intravenous Q12H  . nutrition supplement (JUVEN)  1 packet Per Tube BID BM  . phenytoin (DILANTIN) IV  75 mg Intravenous Q8H    Admission status: Inpatient: Based on patients clinical presentation and evaluation of above clinical data, I have made determination that patient meets Inpatient criteria at this time.  Time spent: 20 min  Meredeth IdeGagan S Gearldean Lomanto   [facility] Pager [phone]. If 7PM-7AM, please contact night-coverage at [email or web address], Office  [phone]  password [email or web address]  10/07/2018, 4:39 PM  LOS: 12 days

## 2018-10-07 NOTE — Care Management Note (Signed)
Case Management Note  Patient Details  Name: Jason Glover. MRN: [id number] Date of Birth: September 10, 1991  Subjective/Objective:   Pt admitted on 09/25/18 with seizure activity and fevers.  Pt with hx of CP and mitochondrial leigh syndrome.  He is normally communicative and able to use a motorized WC, but has not spoken since the seizures.  Pt from [place], [place] in [place].  Action/Plan: Met with mother to discuss home needs.  PEG tube replaced today, as it was displaced en route [facility].  Mom states pt has hospital bed, WC and receives bolus feeds 4 times/ day.  Pt was able to eat prior to this hospitalization. Bedside RN to order dietician consult to assess caloric need for bolus vs nocturnal feeds, since pt unable to eat now.  Mom states she is in need of hoyer lift for home use.  Pt will need ambulance transport back to [facility] upon [employer]; RN Case Manager will arrange.   Expected Discharge Date:                  Expected Discharge Plan:  [place]  In-House Referral:     Discharge planning Services  CM Consult  Post Acute Care Choice:    Choice offered to:     DME Arranged:    DME Agency:     HH Arranged:    [place] Agency:     Status of Service:  In process, will continue to follow  If discussed at Long Length of Stay Meetings, dates discussed:    Additional Comments:  Reinaldo Raddle, RN, BSN  Trauma/Neuro ICU Case Manager [phone]

## 2018-10-07 NOTE — Progress Notes (Signed)
Patient bathed even though mom states "I can do it". Nurse believes that mom should be educated more because she is not properly cleaning him and he already arrived pressure ulcers upon arrival. Infectious Disease Physician came and spoke to patient mom regarding course of action related to antibiotics. ID stated patient will be here a minimal of two more days. Vancomycin to be restarted by Pharmacy based on morning trough. Patient sent to IR and had Mickey replaced. Tube feeds restarted @ 75 ml/hr

## 2018-10-07 NOTE — Progress Notes (Signed)
Pharmacy Antibiotic Note  Jason Heaterhaddeus R Zenner Jr. is a 27 y.o. male admitted on 09/25/2018 with FUO. Pharmacy has been consulted for Meropenem / Vancomycin dosing. Was on acyclovir/aztreonam/vancomycin recently which was changed to meropenem (stopped 12/26).    Seen by ID today who wants to continue vancomycin for another 24 hours to target skin organisms. A vanc peak was collected but trough was inadvertently cancelled. Therefore, unable to calculate AUC.   Plan: Stop meropenem per ID Continue vancomycin 750 mg IV Q8h  Monitor clinical progress, c/s, renal function  Height: 5\' 9"  (175.3 cm) Weight: 105 lb 6.1 oz (47.8 kg) IBW/kg (Calculated) : 70.7  Temp (24hrs), Avg:98.6 F (37 C), Min:98 F (36.7 C), Max:99.1 F (37.3 C)  Recent Labs  Lab 10/01/18 0612 10/02/18 0342 10/03/18 0332 10/04/18 0443 10/04/18 2018 10/04/18 2351 10/07/18 0700  WBC 9.9 10.3 9.5 11.0* 13.2*  --  14.2*  CREATININE 0.56* 0.60* 0.64 0.57*  --   --  0.48*  LATICACIDVEN  --   --   --   --  1.3 1.4  --   VANCOPEAK  --   --   --   --   --   --  32    Estimated Creatinine Clearance: 92.9 mL/min (A) (by C-G formula based on SCr of 0.48 mg/dL (L)).    Allergies  Allergen Reactions  . Ativan [Lorazepam]     Facial swelling  . Rocephin [Ceftriaxone] Hives   Antibiotics This Admission Vancomycin 12/19>>12/22, 12/28>> Acyclovir 12/19>>12/23 Aztreonam 12/19>>12/23 Meropenem 12/23>> 12/26, 12/28>>  Microbiology Results 12/21 flu - neg 12/21 bcx - ngtd 12/20 UC - neg 12/19 mrsa pcr - neg  Vinnie LevelBenjamin Amariss Detamore, PharmD., BCPS Clinical Pharmacist Clinical phone for 10/07/18 until 3:30pm: [phone] If after 3:30pm, please refer to [facility] for unit-specific pharmacist

## 2018-10-07 NOTE — Progress Notes (Signed)
[facility]  [facility] Coord[facility] will follow pt with ID team to support Home Infusion Pharmacy services at home if needed/ordered at DC.  If patient discharges after hours, please call [phone].   Sedalia Mutaamela S Chandler 10/07/2018, 8:14 AM

## 2018-10-08 ENCOUNTER — Encounter (HOSPITAL_COMMUNITY): Payer: Self-pay | Admitting: Family Medicine

## 2018-10-08 DIAGNOSIS — R000 Tachycardia, unspecified: Secondary | ICD-10-CM

## 2018-10-08 DIAGNOSIS — E43 Unspecified severe protein-calorie malnutrition: Secondary | ICD-10-CM

## 2018-10-08 DIAGNOSIS — L89152 Pressure ulcer of sacral region, stage 2: Secondary | ICD-10-CM

## 2018-10-08 HISTORY — DX: Pressure ulcer of sacral region, stage 2: L89.152

## 2018-10-08 MED ORDER — PHENYTOIN 125 MG/5ML PO SUSP
75.0000 mg | Freq: Three times a day (TID) | ORAL | Status: DC
  Administered 2018-10-08 – 2018-10-10 (×6): 75 mg
  Filled 2018-10-08 (×7): qty 4

## 2018-10-08 MED ORDER — METOPROLOL TARTRATE 25 MG/10 ML ORAL SUSPENSION
12.5000 mg | Freq: Two times a day (BID) | ORAL | Status: DC
  Administered 2018-10-08 – 2018-10-17 (×18): 12.5 mg
  Filled 2018-10-08 (×19): qty 5

## 2018-10-08 MED ORDER — LACOSAMIDE 50 MG PO TABS
200.0000 mg | ORAL_TABLET | Freq: Two times a day (BID) | ORAL | Status: DC
  Administered 2018-10-08 – 2018-10-17 (×17): 200 mg
  Filled 2018-10-08 (×18): qty 4

## 2018-10-08 MED ORDER — DIPHENHYDRAMINE HCL 12.5 MG/5ML PO ELIX
25.0000 mg | ORAL_SOLUTION | Freq: Four times a day (QID) | ORAL | Status: DC | PRN
  Administered 2018-10-08 – 2018-10-10 (×4): 25 mg
  Filled 2018-10-08 (×4): qty 10

## 2018-10-08 MED ORDER — LEVETIRACETAM 100 MG/ML PO SOLN
1500.0000 mg | Freq: Two times a day (BID) | ORAL | Status: DC
  Administered 2018-10-08 – 2018-10-17 (×18): 1500 mg
  Filled 2018-10-08 (×18): qty 15

## 2018-10-08 MED ORDER — LACOSAMIDE 10 MG/ML PO SOLN: 200.0000 mg | Freq: Two times a day (BID) | ORAL | Status: DC

## 2018-10-08 MED ORDER — DIPHENHYDRAMINE HCL 25 MG PO CAPS
25.0000 mg | ORAL_CAPSULE | Freq: Four times a day (QID) | ORAL | Status: DC | PRN
  Filled 2018-10-08: qty 1

## 2018-10-08 NOTE — Plan of Care (Signed)
  Problem: Education: Goal: Knowledge of General Education information will improve Description Including pain rating scale, medication(s)/side effects and non-pharmacologic comfort measures 10/08/2018 1837 by Garey Ham, RN Outcome: Progressing 10/08/2018 1836 by Garey Ham, RN Outcome: Progressing   Problem: Health Behavior/Discharge Planning: Goal: Ability to manage health-related needs will improve Outcome: Progressing   Problem: Clinical Measurements: Goal: Ability to maintain clinical measurements within normal limits will improve Outcome: Progressing Goal: Will remain free from infection Outcome: Progressing Goal: Diagnostic test results will improve Outcome: Progressing Goal: Respiratory complications will improve Outcome: Progressing Goal: Cardiovascular complication will be avoided Outcome: Progressing   Problem: Coping: Goal: Level of anxiety will decrease Outcome: Progressing   Problem: Elimination: Goal: Will not experience complications related to bowel motility Outcome: Progressing Goal: Will not experience complications related to urinary retention Outcome: Progressing   Problem: Safety: Goal: Ability to remain free from injury will improve Outcome: Progressing   Problem: Skin Integrity: Goal: Risk for impaired skin integrity will decrease Outcome: Progressing   Problem: Pain Management Goal: Ability to return to baseline activity level will improve Description Manage pain with FACES scale  10/08/2018 1837 by Garey Ham, RN Outcome: Progressing 10/08/2018 1836 by Garey Ham, RN Outcome: Progressing   Problem: Pain Management Goal: Ability to return to baseline activity level will improve Description Manage pain with FACES scale  10/08/2018 1837 by Garey Ham, RN Outcome: Progressing 10/08/2018 1836 by Garey Ham, RN Outcome: Progressing

## 2018-10-08 NOTE — Progress Notes (Signed)
INFECTIOUS DISEASE PROGRESS NOTE  ID: Jason Heaterhaddeus R Totten Jr. is a 28 y.o. male with  Principal Problem:   Seizures (HCC) Active Problems:   Pressure injury of skin   Fever   Mitochondrial Leigh syndrome   PEG tube malfunction (HCC)   Constipation   Protein-calorie malnutrition, severe  Subjective: Awake but does not follow commands for me.  Nurse notes subtle facial twitching.   Abtx:  Anti-infectives (From admission, onward)   Start     Dose/Rate Route Frequency Ordered Stop   10/07/18 1500  vancomycin (VANCOCIN) IVPB 750 mg/150 ml premix     750 mg 150 mL/hr over 60 Minutes Intravenous Every 12 hours 10/07/18 1426     10/05/18 0400  vancomycin (VANCOCIN) IVPB 750 mg/150 ml premix  Status:  Discontinued     750 mg 150 mL/hr over 60 Minutes Intravenous Every 8 hours 10/04/18 1946 10/07/18 1426   10/04/18 2000  vancomycin (VANCOCIN) IVPB 1000 mg/200 mL premix     1,000 mg 200 mL/hr over 60 Minutes Intravenous  Once 10/04/18 1946 10/04/18 2200   10/04/18 2000  meropenem (MERREM) 1 g in sodium chloride 0.9 % 100 mL IVPB     1 g 200 mL/hr over 30 Minutes Intravenous Every 8 hours 10/04/18 1946     09/29/18 1400  meropenem (MERREM) 1 g in sodium chloride 0.9 % 100 mL IVPB     1 g 200 mL/hr over 30 Minutes Intravenous Every 8 hours 09/29/18 1306 10/02/18 2130   09/26/18 1100  vancomycin (VANCOCIN) 500 mg in sodium chloride 0.9 % 100 mL IVPB  Status:  Discontinued     500 mg 100 mL/hr over 60 Minutes Intravenous Every 8 hours 09/26/18 1015 09/28/18 1224   09/25/18 1700  vancomycin (VANCOCIN) IVPB 1000 mg/200 mL premix     1,000 mg 200 mL/hr over 60 Minutes Intravenous  Once 09/25/18 1608 09/25/18 2000   09/25/18 1700  aztreonam (AZACTAM) 2 g in sodium chloride 0.9 % 100 mL IVPB  Status:  Discontinued     2 g 200 mL/hr over 30 Minutes Intravenous Every 8 hours 09/25/18 1608 09/29/18 1301   09/25/18 1700  acyclovir (ZOVIRAX) 450 mg in dextrose 5 % 100 mL IVPB  Status:   Discontinued     450 mg 109 mL/hr over 60 Minutes Intravenous Every 8 hours 09/25/18 1608 09/29/18 1301      Medications:  Scheduled: . free water  30 mL Per Tube Q4H  . levOCARNitine  250 mg Per Tube Daily  . nutrition supplement (JUVEN)  1 packet Per Tube BID BM  . phenytoin (DILANTIN) IV  75 mg Intravenous Q8H    Objective: Vital signs in last 24 hours: Temp:  [97.6 F (36.4 C)-99.7 F (37.6 C)] 99.1 F (37.3 C) (01/01 1137) Pulse Rate:  [102-127] 123 (01/01 1200) Resp:  [12-18] 13 (01/01 1200) BP: (103-148)/(59-93) 121/76 (01/01 1137) SpO2:  [96 %-100 %] 100 % (01/01 1200)   General appearance: alert and no distress Resp: clear to auscultation bilaterally Cardio: regular rate and rhythm GI: normal findings: bowel sounds normal and soft, non-tender  Lab Results Recent Labs    10/07/18 0700  WBC 14.2*  HGB 10.7*  HCT 32.6*  NA 138  K 4.1  CL 106  CO2 21*  BUN 23*  CREATININE 0.48*   Liver Panel No results for input(s): PROT, ALBUMIN, AST, ALT, ALKPHOS, BILITOT, BILIDIR, IBILI in the last 72 hours. Sedimentation Rate No results for input(s): ESRSEDRATE  in the last 72 hours. C-Reactive Protein No results for input(s): CRP in the last 72 hours.  Microbiology: Recent Results (from the past 240 hour(s))  Culture, Urine     Status: None   Collection Time: 10/04/18  6:59 PM  Result Value Ref Range Status   Specimen Description URINE, RANDOM  Final   Special Requests NONE  Final   Culture   Final    NO GROWTH Performed at [facility], [address] [place], [place] [address]    Report Status 10/07/2018 FINAL  Final  Culture, blood (Routine X 2) w Reflex to ID Panel     Status: None (Preliminary result)   Collection Time: 10/04/18  8:13 PM  Result Value Ref Range Status   Specimen Description BLOOD LEFT ANTECUBITAL  Final   Special Requests   Final    BOTTLES DRAWN AEROBIC AND ANAEROBIC Blood Culture adequate volume   Culture   Final    NO  GROWTH 3 DAYS Performed at [facility], [address] [place], [place] [address]    Report Status PENDING  Incomplete  Culture, blood (Routine X 2) w Reflex to ID Panel     Status: None (Preliminary result)   Collection Time: 10/04/18  8:18 PM  Result Value Ref Range Status   Specimen Description BLOOD LEFT WRIST  Final   Special Requests   Final    BOTTLES DRAWN AEROBIC AND ANAEROBIC Blood Culture adequate volume   Culture   Final    NO GROWTH 3 DAYS Performed at [facility], [address] [place], [place] [address]    Report Status PENDING  Incomplete    Studies/Results: Ir Replc Gastro/colonic Tube Percut W/fluoro  Result Date: 10/07/2018 INDICATION: Malfunctioning gastrostomy tube.  Plan for gastrostomy tube exchange EXAM: GASTROSTOMY TUBE EXCHANGE WITH FLUOROSCOPY MEDICATIONS: None ANESTHESIA/SEDATION: None CONTRAST:  5 mL-administered into the gastric lumen. FLUOROSCOPY TIME:  Fluoroscopy Time 12 seconds, 0.4 mGy. COMPLICATIONS: None immediate. PROCEDURE: Informed written consent was obtained from the patient's mother after a thorough discussion of the procedural risks, benefits and alternatives. All questions were addressed. A timeout was performed prior to the initiation of the procedure. The existing catheter was prepped and draped. Old catheter balloon was no longer inflated and the tube was easily removed with traction. A new 16 French, 3 cm MIC-KEY button gastrostomy tube was easily advanced in the stomach. Balloon was inflated with 5 mL of saline. Contrast injection confirmed placement in the stomach. IMPRESSION: Successful exchange of the MIC-KEY button gastrostomy tube. Electronically Signed   By: Richarda Overlie M.D.   On: 10/07/2018 17:20     Assessment/Plan: FUO Leigh Syndrome Seizures Decubitus ulcers  Total days of antibiotics: 4 vanco/merrem  Will stop vanco and merrem Watch temp curve Prior BCx (-), most recent are ngtd. His UA is (-). CXR 12-28  (-) and has no resp distress.  Available as needed.          Johny Sax MD, FACP Infectious Diseases (pager) [phone] [email or web address] 10/08/2018, 12:58 PM  LOS: 13 days

## 2018-10-08 NOTE — Plan of Care (Signed)
  Problem: Education: Goal: Knowledge of General Education information will improve Description Including pain rating scale, medication(s)/side effects and non-pharmacologic comfort measures Outcome: Progressing   Problem: Pain Management Goal: Ability to return to baseline activity level will improve Description Manage pain with FACES scale  Outcome: Progressing   Problem: Open Wound Care Goal: Ability to return to baseline activity level will improve Outcome: Progressing   Problem: Spiritual Needs Goal: Ability to function at adequate level Outcome: Progressing

## 2018-10-08 NOTE — Progress Notes (Signed)
PROGRESS NOTE  [name]. [id number]:[id number] DOB: [birth year] DOA: 09/25/2018 PCP: Johny Blamer, MD   Brief History   28 year old man PMH mitochondrial Leigh syndrome, pressure ulcers, feeding tubes placement, transferred from [facility] after presenting with acute encephalopathy, fever and uncontrollable movements right upper extremity.  At baseline able to communicate uses a motorized wheelchair.  Admitted for further evaluation and treatment of focal status epilepticus and fever.  Seen by neurology, stat EEG showed no epileptiform discharges and patient loaded with Vimpat.  Patient followed by neurology, long-term EEG was negative but cannot rule out focal status epilepticus.  Despite being no longer febrile, being treated with antibiotics and having extensive work-up as well as consultation with infectious disease in 3 antiepileptics as per neurology there is been no significant change in his condition.  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  Initial EEG as well as long-term EEG showed no evidence of seizures but can miss simple partial status.  Started on thiamine per neurology.  MRI brain showed restricted diffusion left frontal cortex which could represent ongoing seizures.  However neurology recommended no change to antiepileptics as there was no EEG correlate. Treated with thiamine 500 mg 3 times daily. --Continues to have intermittent facial and right arm twitching.   --Avoid valproic acid given mitochondrial disease --Change Dilantin, Vimpat, Keppra to per tube as per pharmacy --Follow-up with Dr. Sharene Skeans on discharge --Use diazepam for seizures  Fever unknown origin. On admission treated empirically with acyclovir, aztreonam and vancomycin initially.  Mother declined lumbar puncture.  Chest x-ray and urinalysis negative.  Influenza negative.  Subsequently antibiotics were stopped, then had intermittent fever, prompting  further antibiotics, eventually prompting infectious disease consultation.   --Low-grade temperature 12/29, no true fever since 12/28.   --Prior blood cultures no growth, repeat blood cultures no growth thus far.  Urinalysis and chest x-ray is negative.  No respiratory distress.  Differential felt to be antiepileptics or other drugs or central fever from leg syndrome demyelinization.  Infectious disease recommended stopping antibiotics 1/1 and monitoring temperature curve.  Narrow complex tachycardia on telemetry --Present for approximately last 10 days of hospitalization.  Presumably sinus.  Will check EKG to confirm.  Consider beta-blocker.  Mitochondrial Leigh syndrome --Continue levocarnitine per tube  PEG tube malfunction on admission 12/19 --Exchanged 12/31.  Unclear when may use.  Will discuss with radiology.  Angioedema.  Resolved.  Developed 12/27 with upper lip, right earlobe edema after receiving IV Ativan. --Resolved.  Ativan was stopped.  Treated with steroids and Benadryl.  Sacral ulcer stage II, stage I ulcers left lateral malleolus, right foot --Wound care.  Severe malnutrition --Continue tube feeds   Discussed in detail with mother.  Change seizure medications to tube route, start using PEG tube tomorrow, monitor fever curve, check EKG to confirm rhythm, consider beta-blocker.  DVT prophylaxis: SCDs Code Status: Full Family Communication: mother at bedside  Disposition Plan: home    Brendia Sacks, MD  [facility] Direct contact: [phone] --Via amion app OR  --[email or web address]; password [email or web address]  7PM-7AM contact night coverage as above 10/08/2018, 3:22 PM  LOS: 13 days   Consultants  . Neurology   Procedures  . EEG Clinical interpretation: This 15 hours of intensive EEG monitoring with simultaneous video monitoring did not record any clinical subclinical seizures.  Background activities marked by background activity slowing and presence of frontally  dominant intermittent rhythmic delta activities.  These findings suggestive of mild encephalopathy of nonspecific etiologies.  Metabolic causes would be favored.  Spells accompanied by a right arm twitching were not seizures.  Clinical correlation is advised.  Cortrak tube placement 12/23  Antibiotics  .   Interval History/Subjective  Per mother patient remains nonverbal with intermittent twitching of face and right arm.  At baseline he is able to speak, put on his own close and prevent.  He has been nonverbal since prior to admission.  She reports intermittently over the years he has spells where he will be nonverbal for several months but he is not previously had seizures.  Objective   Vitals:  Vitals:   10/08/18 1137 10/08/18 1200  BP: 121/76   Pulse: (!) 123 (!) 123  Resp: 12 13  Temp: 99.1 F (37.3 C)   SpO2: 100% 100%    Exam:  Constitutional:  . Appears calm and comfortable Eyes:  Marland Kitchen Eyes disconjugate ENMT:  . Cannot assess hearing Respiratory:  . CTA bilaterally, no w/r/r.  . Respiratory effort normal.  Cardiovascular:  . Tachycardic, regular rhythm, no m/r/g . No LE extremity edema   Musculoskeletal:  . Right upper extremity.  Some twitching noted intermittently as well as left facial twitching noted intermittently. Psychiatric:  . Mental status o Mood, affect cannot assess o Orientation cannot be assessed  I have personally reviewed the following:   Data: . Urine output 1050.  +5.5 L since admission. Marland Kitchen BMP notable for BUN of 23, otherwise unremarkable.  CO2 slightly low at 21 but improved over admission.  WBC 14.2, stable.  Hemoglobin stable at 10.7.  Scheduled Meds: . free water  30 mL Per Tube Q4H  . lacosamide  200 mg Per Tube BID  . levETIRAcetam  1,500 mg Per Tube BID  . levOCARNitine  250 mg Per Tube Daily  . nutrition supplement (JUVEN)  1 packet Per Tube BID BM  . phenytoin  75 mg Per Tube TID   Continuous Infusions: . sodium chloride 10  mL/hr at 10/07/18 0900  . feeding supplement (JEVITY 1.2 CAL) 1,000 mL (10/08/18 0303)    Principal Problem:   Seizures (HCC) Active Problems:   Pressure injury of skin   Fever   Mitochondrial Leigh syndrome   PEG tube malfunction (HCC)   Constipation   Protein-calorie malnutrition, severe   Sacral decubitus ulcer, stage II (HCC)   LOS: 13 days    Time spent 40 minutes, greater than 50% in counseling and coordination of care, discussion with mother and nurse.

## 2018-10-08 NOTE — Progress Notes (Signed)
RN currently on the phone with Josh, Pharmacists with concerns regarding the administration of patient's IV Phenytoin 75mg  Q8hrs. Per Pharmacists, IV push of Phenytoin is ok via IV push. RN advised to push total dose over a three minute timeframe, RN also encouraged to flush before and after with NS.

## 2018-10-09 LAB — CULTURE, BLOOD (ROUTINE X 2)
Culture: NO GROWTH
Culture: NO GROWTH
Special Requests: ADEQUATE
Special Requests: ADEQUATE

## 2018-10-09 LAB — PHENYTOIN LEVEL, TOTAL: Phenytoin Lvl: 2.8 ug/mL — ABNORMAL LOW (ref 10.0–20.0)

## 2018-10-09 MED ORDER — JEVITY 1.2 CAL PO LIQD
1000.0000 mL | ORAL | Status: DC
  Administered 2018-10-09 – 2018-10-10 (×3): 1000 mL
  Filled 2018-10-09 (×5): qty 1000

## 2018-10-09 MED ORDER — JEVITY 1.2 CAL PO LIQD
1000.0000 mL | ORAL | Status: DC
  Administered 2018-10-09: 1000 mL
  Filled 2018-10-09: qty 1000

## 2018-10-09 MED ORDER — SODIUM CHLORIDE 0.9 % IV BOLUS
500.0000 mL | Freq: Once | INTRAVENOUS | Status: AC
  Administered 2018-10-09: 500 mL via INTRAVENOUS

## 2018-10-09 NOTE — Progress Notes (Signed)
Nurse paged IR @ [phone] per order to see if PEG can be used, PEG was replaced on 10/07/18.  Patient still receiving nutrition via coretrak.  After IR gives green light for use, dietician needs to discuss increase in feeds/continuous tube feeds upon discharge since patient is currently not taking anything by mouth, which is a change in his baseline.

## 2018-10-09 NOTE — Progress Notes (Addendum)
IR returned call to Nurse, PEG can be used as long as patient is afebrile and has no complications. Nurse rechecked temperature and it was 99.0, changed dressing on PEG.  Dietician was contacted and message relayed.

## 2018-10-09 NOTE — Progress Notes (Signed)
Patient seen by wound care RN, Helmut Muster who assessed wound and applied hydrocolloid dressing. Mom who is at bedside educated on why it is used and how often to change (3-5 days).   Patient cleaned from a small bowel movement and turned.   Hydrocolloid dressings ordered from material management.

## 2018-10-09 NOTE — Consult Note (Signed)
WOC Nurse wound consult note Patient receiving care in [facility] 4NP 14.  Mother and primary RN at bedside. Reason for Consult: "wound care" Wound type: Stage 2 to left trochanter Pressure Injury POA: Yes, however, documented as "buttocks" Measurement: 3.3 cm x 1.4 cm x no measurable depth.  Wound only needs to re-epithelialize to be healed. Wound bed: 100% pink, moist. Drainage (amount, consistency, odor) No odor, no drainage, no induration Periwound: Intact, normal color and texture skin  Dressing procedure/placement/frequency: Apply skin prep around the wound on the left trochanter.  Allow to air dry.  Place a hydrocolloid [place] # [id number]) over the wound. Date the dressing. Change every 3 - 5 day and prn. Monitor the wound area(s) for worsening of condition such as: Signs/symptoms of infection,  Increase in size,  Development of or worsening of odor, Development of pain, or increased pain at the affected locations.  Notify the medical team if any of these develop.  Thank you for the consult.  Discussed plan of care with the patient and bedside nurse.  WOC nurse will not follow at this time.  Please re-consult the WOC team if needed.  Helmut Muster, RN, MSN, CWOCN, CNS-BC, pager [phone]

## 2018-10-09 NOTE — Progress Notes (Signed)
PROGRESS NOTE  Jason Glover. YWV:[id number] DOB: [birth year] DOA: 09/25/2018 PCP: Johny Blamer, MD   Brief History   28 year old man PMH mitochondrial Leigh syndrome, pressure ulcers, feeding tubes placement, transferred from [facility] after presenting with acute encephalopathy, fever and uncontrollable movements right upper extremity.  At baseline able to communicate uses a motorized wheelchair.  Admitted for further evaluation and treatment of focal status epilepticus and fever.  Seen by neurology, stat EEG showed no epileptiform discharges and patient loaded with Vimpat.  Patient followed by neurology, long-term EEG was negative but cannot rule out focal status epilepticus.  Despite being no longer febrile, being treated with antibiotics and having extensive work-up as well as consultation with infectious disease in 3 antiepileptics as per neurology there is been no significant change in his condition.  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  Initial EEG as well as long-term EEG showed no evidence of seizures but can miss simple partial status.  Started on thiamine per neurology.  MRI brain showed restricted diffusion left frontal cortex which could represent ongoing seizures.  However neurology recommended no change to antiepileptics as there was no EEG correlate. Treated with thiamine 500 mg 3 times daily. --Reportedly continues to have intermittent twitching, none observed on exam today --Continue Dilantin, Vimpat, Keppra --Avoid valproic acid given mitochondrial disease --Follow-up with Dr. Sharene Skeans on discharge --Use diazepam for seizures  Fever unknown origin. On admission treated empirically with acyclovir, aztreonam and vancomycin initially.  Mother declined lumbar puncture.  Chest x-ray and urinalysis negative.  Influenza negative.  Subsequently antibiotics were stopped, then had intermittent fever, prompting further  antibiotics, eventually prompting infectious disease consultation.   --Remains afebrile.  No true fever since 12/28.   --Prior blood cultures no growth, repeat blood cultures no growth.  Urinalysis and chest x-ray negative.  No respiratory distress.  Differential felt to be antiepileptics or other drugs or central fever from Leigh syndrome demyelinization.  Infectious disease recommended stopping antibiotics 1/1 and monitoring temperature curve. --Continue to monitor temperature curve  Sinus tachycardia  --Present for approximately last 10 days of hospitalization. --EKG showed sinus tachycardia, no acute changes --Started on metoprolol, bolus IV fluids, start tube feeds via PEG.  Mitochondrial Leigh syndrome --Continue levocarnitine per tube  PEG tube malfunction on admission 12/19. Exchanged 12/31.   --Start PEG tube feeds 1/2  Angioedema.  Resolved.  Developed 12/27 with upper lip, right earlobe edema after receiving IV Ativan. --Resolved.  Ativan was stopped.  Treated with steroids and Benadryl.  Stage II left trochanter pressure ulcer, stage I ulcers left lateral malleolus, right foot --Continue wound care per RN  Severe malnutrition --Continue tube feeds.   Continue to assess response to tube feeds, if tolerates, likely home in the next 48 hours.  Appears to be approaching maximum benefit from hospitalization.  DVT prophylaxis: SCDs Code Status: Full Family Communication: mother at bedside  Disposition Plan: home    Brendia Sacks, MD  [facility] Direct contact: [phone] --Via amion app OR  --[email or web address]; password [email or web address]  7PM-7AM contact night coverage as above 10/09/2018, 3:59 PM  LOS: 14 days   Consultants  . Neurology   Procedures  . EEG Clinical interpretation: This 15 hours of intensive EEG monitoring with simultaneous video monitoring did not record any clinical subclinical seizures.  Background activities marked by background activity slowing and  presence of frontally dominant intermittent rhythmic delta activities.  These findings suggestive of mild encephalopathy of nonspecific etiologies.  Metabolic causes would be favored.  Spells accompanied by a right arm twitching were not seizures.  Clinical correlation is advised.  Cortrak tube placement 12/23  Antibiotics  .   Interval History/Subjective  No new events, mother notes he had some twitching of the arm earlier in the morning.  Heart rate has decreased.  Patient is nonverbal.  Objective   Vitals:  Vitals:   10/09/18 1117 10/09/18 1506  BP: 118/82 113/71  Pulse: (!) 111 (!) 106  Resp: 14 18  Temp: 99 F (37.2 C) 98.6 F (37 C)  SpO2: 98%     Exam: Constitutional:   . Appears calm.  Difficult to assess comfort. Eyes:  Marland Kitchen. Blinks eyes to voice Respiratory:  . CTA bilaterally, no w/r/r.  . Respiratory effort normal. Cardiovascular:  . Tachycardia, no m/r/g . No LE extremity edema   Abdomen:  . Soft, nontender, nondistended Musculoskeletal:  . Bilateral upper and lower extremity contractures Psychiatric:  . Mental status . Remains nonverbal.  Does blink eyes when asked to do so.  No twitching seen during examination.   I have personally reviewed the following:   Data: . Urine output 875  Scheduled Meds: . free water  30 mL Per Tube Q4H  . lacosamide  200 mg Per Tube BID  . levETIRAcetam  1,500 mg Per Tube BID  . levOCARNitine  250 mg Per Tube Daily  . metoprolol tartrate  12.5 mg Per Tube BID  . nutrition supplement (JUVEN)  1 packet Per Tube BID BM  . phenytoin  75 mg Per Tube TID   Continuous Infusions: . sodium chloride 10 mL/hr at 10/09/18 0920  . feeding supplement (JEVITY 1.2 CAL)      Principal Problem:   Seizures (HCC) Active Problems:   Pressure injury of skin   Fever   Mitochondrial Leigh syndrome   PEG tube malfunction (HCC)   Constipation   Protein-calorie malnutrition, severe   Sacral decubitus ulcer, stage II (HCC)   LOS:  14 days

## 2018-10-09 NOTE — Progress Notes (Signed)
Nutrition Follow-up  DOCUMENTATION CODES:   Severe malnutrition in context of acute illness/injury  INTERVENTION:   Recommend initiating Jevity 1.2 formula via PEG at starting rate of 50 ml/hr and increasing by 10 ml every 4 hours to goal rate of 80 ml/hr to provide 2304 kcal, 107 grams of protein, 1556 ml water.    Continue free water flushes of 30 ml q 4 hours. (MD to adjust as appropriate)    Continue 1 packet Juven BID per tube, each packet provides 80 calories, 8 grams of carbohydrate, and 14 grams of amino acids; supplement contains CaHMB, glutamine, and arginine, to promote wound healing  Once able to tolerate continuous feeds at goal rate via PEG,  While NPO, recommend transitioning to bolus feeds using Jevity 1.2 formula with goal of 474 ml (2 cans) QID (administer boluses as tolerated) to provide 2275 kcal and 105 grams of protein. Recommend free water flushes of 150 ml TID between boluses. Total free water: 1985 ml.   NUTRITION DIAGNOSIS:   Severe Malnutrition related to acute illness as evidenced by moderate fat depletion, severe muscle depletion, energy intake < or equal to 50% for > or equal to 5 days, percent weight loss; ongoing  GOAL:   Patient will meet greater than or equal to 90% of their needs; met with TF  MONITOR:   Diet advancement, Weight trends, PO intake, Skin, TF tolerance  REASON FOR ASSESSMENT:   Consult Enteral/tube feeding initiation and management  ASSESSMENT:   28 yo male, admitted with seizures. PMH significant for mitochondreal Leigh syndrome, pressure ulcers. Has PEG tube. Home meds list includes vitamin B-complex, ferrous sulfate, L-carnitine, Miralax.  New PEG exchanged 12/31. IR has cleared to use PEG today. RN reinitiating tube feeding at starting rate of 50 ml/hr and will monitor for tolerance. Discussed with RN, new goal rate of 80 ml/hr. If pt tolerates continuous feeds at goal rate, recommend transitioning to bolus feeds for  anticipation of home. Noted pt continues NPO due to current mental status. RN reports pt starting to follow commands, however will be NPO until back to mental baseline. RD to continue to monitor.   Diet Order:   Diet Order    None      EDUCATION NEEDS:   Not appropriate for education at this time  Skin:  Skin Assessment: Skin Integrity Issues: Skin Integrity Issues:: Stage I, Stage II Stage I: R foot Stage II: L buttocks Unstageable: N/A  Last BM:  1/1  Height:   Ht Readings from Last 1 Encounters:  09/29/18 _0  (1.753 m)    Weight:   Wt Readings from Last 1 Encounters:  09/25/18 47.8 kg    Ideal Body Weight:  72.7 kg  BMI:  Body mass index is 15.56 kg/m.  Estimated Nutritional Needs:   Kcal:  8768-1157 calories daily (30-35 kcal/kg IBW)  Protein:  87-109 gm daily (1.2-1.5 g/kg IBW)  Fluid:  >/= 2.1 L daily or per MD discretion    Corrin Parker, MS, RD, LDN Pager # [phone] After hours/ weekend pager # [phone]

## 2018-10-09 NOTE — Progress Notes (Signed)
Using PEG instead of coretrak. Goal of 80 met with no complications this shift.

## 2018-10-10 DIAGNOSIS — R7401 Elevation of levels of liver transaminase levels: Secondary | ICD-10-CM

## 2018-10-10 DIAGNOSIS — R740 Nonspecific elevation of levels of transaminase and lactic acid dehydrogenase [LDH]: Secondary | ICD-10-CM

## 2018-10-10 LAB — COMPREHENSIVE METABOLIC PANEL
ALT: 188 U/L — ABNORMAL HIGH (ref 0–44)
AST: 85 U/L — ABNORMAL HIGH (ref 15–41)
Albumin: 2.3 g/dL — ABNORMAL LOW (ref 3.5–5.0)
Alkaline Phosphatase: 110 U/L (ref 38–126)
Anion gap: 9 (ref 5–15)
BUN: 24 mg/dL — ABNORMAL HIGH (ref 6–20)
CO2: 20 mmol/L — ABNORMAL LOW (ref 22–32)
Calcium: 8.6 mg/dL — ABNORMAL LOW (ref 8.9–10.3)
Chloride: 108 mmol/L (ref 98–111)
Creatinine, Ser: 0.48 mg/dL — ABNORMAL LOW (ref 0.61–1.24)
GFR calc Af Amer: >60 mL/min (ref >60)
GFR calc non Af Amer: >60 mL/min (ref >60)
Glucose, Bld: 108 mg/dL — ABNORMAL HIGH (ref 70–99)
Potassium: 4.2 mmol/L (ref 3.5–5.1)
SODIUM: 137 mmol/L (ref 135–145)
Total Bilirubin: 0.2 mg/dL — ABNORMAL LOW (ref 0.3–1.2)
Total Protein: 5.9 g/dL — ABNORMAL LOW (ref 6.5–8.1)

## 2018-10-10 LAB — GLUCOSE, CAPILLARY
GLUCOSE-CAPILLARY: 100 mg/dL — AB (ref 70–99)
Glucose-Capillary: 101 mg/dL — ABNORMAL HIGH (ref 70–99)

## 2018-10-10 MED ORDER — PHENYTOIN 125 MG/5ML PO SUSP
100.0000 mg | Freq: Every day | ORAL | Status: DC
  Administered 2018-10-11: 100 mg
  Filled 2018-10-10: qty 4

## 2018-10-10 MED ORDER — PHENYTOIN 125 MG/5ML PO SUSP
75.0000 mg | Freq: Two times a day (BID) | ORAL | Status: DC
  Administered 2018-10-10 (×2): 75 mg
  Filled 2018-10-10 (×2): qty 4

## 2018-10-10 MED ORDER — SODIUM CHLORIDE 0.9 % IV BOLUS
500.0000 mL | Freq: Once | INTRAVENOUS | Status: AC
  Administered 2018-10-10: 500 mL via INTRAVENOUS

## 2018-10-10 NOTE — Progress Notes (Signed)
PROGRESS NOTE  Jason Glover. 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  At baseline able to communicate uses a motorized wheelchair.  Admitted for further evaluation and treatment of focal status epilepticus and fever.  Seen by neurology, stat EEG showed no epileptiform discharges and patient loaded with Vimpat.  Patient followed by neurology, long-term EEG was negative but cannot rule out focal status epilepticus.  Despite being no longer febrile, being treated with antibiotics and having extensive work-up as well as consultation with infectious disease in 3 antiepileptics as per neurology there is been no significant change in his condition.  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  Initial EEG as well as long-term EEG showed no evidence of seizures but can miss simple partial status.  Started on thiamine per neurology.  MRI brain showed restricted diffusion left frontal cortex which could represent ongoing seizures.  However neurology recommended no change to antiepileptics as there was no EEG correlate. Treated with thiamine 500 mg 3 times daily. --Seems improved today, following some commands per nursing, no significant seizure activity noted. --Continue Dilantin, Vimpat, Keppra --Avoid valproic acid given mitochondrial disease --Follow-up with Dr. Sharene Skeans on discharge --Use diazepam for seizures  Elevated transaminases --New.  Doubt secondary to tube feeds as the patient has been on long-term tube feeds.  May be of no consequence, but concerned possible reaction to Dilantin. --No changes today but check LFTs in a.m.  If continue to rise, may need to stop  Dilantin.  Severe malnutrition --Continue PEG tube feeds.  Remove Cortrak tube.  Fever unknown origin. On admission treated empirically with acyclovir, aztreonam and vancomycin initially.  Mother declined lumbar puncture.  Chest x-ray and urinalysis negative.  Influenza negative.  Subsequently antibiotics were stopped, then had intermittent fever, prompting further antibiotics, eventually prompting infectious disease consultation.   --Low-grade temperature.  No true fever since 12/28.   --Prior blood cultures no growth, repeat blood cultures no growth.  Urinalysis and chest x-ray negative.  No respiratory distress.  Differential felt to be antiepileptics or other drugs or central fever from Leigh syndrome demyelinization.  Infectious disease recommended stopping antibiotics 1/1 and monitoring temperature curve. --Continue to monitor temperature curve.  Sinus tachycardia  --Present for approximately last 12 days of hospitalization. --EKG showed sinus tachycardia, no acute changes --Continue metoprolol  Mitochondrial Leigh syndrome --Continue levocarnitine per tube  PEG tube malfunction on admission 12/19. Exchanged 12/31.    Angioedema.  Resolved.  Developed 12/27 with upper lip, right earlobe edema after receiving IV Ativan. --Resolved.  Ativan was stopped.  Treated with steroids and Benadryl.  Stage II left trochanter pressure ulcer, stage I ulcers left lateral malleolus, right foot --Continue wound care per RN   Continue tube feeds.  Repeat LFTs in a.m.  Once LFTs stable, likely discharge home.  DVT prophylaxis: SCDs Code Status: Full Family Communication: Mother at bedside Disposition Plan: home    Brendia Sacks, MD  [facility] Direct contact: [phone] --Via amion app OR  --[email or web address]; password [email or web address]  7PM-7AM contact night coverage as above 10/10/2018, 12:56 PM  LOS: 15 days   Consultants  . Neurology   Procedures  . EEG Clinical interpretation: This  15 hours of intensive EEG monitoring with simultaneous video monitoring did not record any  clinical subclinical seizures.  Background activities marked by background activity slowing and presence of frontally dominant intermittent rhythmic delta activities.  These findings suggestive of mild encephalopathy of nonspecific etiologies.  Metabolic causes would be favored.  Spells accompanied by a right arm twitching were not seizures.  Clinical correlation is advised.  Cortrak tube placement 12/23  Antibiotics  .   Interval History/Subjective  Per RN, much improved today.  No significant seizure activity.  Following some commands with minimal hand movement, attempted to speak calmly using blink to communicate.  Mother reports patient is much improved today.  Objective   Vitals:  Vitals:   10/10/18 0754 10/10/18 1209  BP: 116/75   Pulse:    Resp: 18   Temp: 100 F (37.8 C) (!) 100.6 F (38.1 C)  SpO2:      Exam: Constitutional:   . Appears calm and comfortable Respiratory:  . CTA bilaterally, no w/r/r.  . Respiratory effort normal. e Cardiovascular:  . Tachycardic, no m/r/g . Heart rate 105 . No LE extremity edema   Abdomen:  . Soft.  PEG tube appears to be in place. Musculoskeletal:  . RLE, LLE   . Bilateral contractures noted Psychiatric:  . Mental status . Mood, affect cannot be assessed.   I have personally reviewed the following:   Data: . Urine output 402?  BM x1. . I/O appears to be grossly inaccurate.  There is no evidence of volume overload, patient certainly not +10 L.  Scheduled Meds: . free water  30 mL Per Tube Q4H  . lacosamide  200 mg Per Tube BID  . levETIRAcetam  1,500 mg Per Tube BID  . levOCARNitine  250 mg Per Tube Daily  . metoprolol tartrate  12.5 mg Per Tube BID  . nutrition supplement (JUVEN)  1 packet Per Tube BID BM  . [START ON 10/11/2018] phenytoin  100 mg Per Tube Daily   And  . phenytoin  75 mg Per Tube BID   Continuous Infusions: .  sodium chloride 10 mL/hr at 10/09/18 0920  . feeding supplement (JEVITY 1.2 CAL) 1,000 mL (10/10/18 0600)  . sodium chloride      Principal Problem:   Seizures (HCC) Active Problems:   Pressure injury of skin   Fever   Mitochondrial Leigh syndrome   PEG tube malfunction (HCC)   Constipation   Protein-calorie malnutrition, severe   Sacral decubitus ulcer, stage II (HCC)   Elevated transaminase level   LOS: 15 days

## 2018-10-10 NOTE — Progress Notes (Signed)
MEDICATION RELATED CONSULT NOTE  Pharmacy Consult for Phenytoin Indication: seizures  Allergies  Allergen Reactions  . Ativan [Lorazepam]     Facial swelling  . Rocephin [Ceftriaxone] Hives    Patient Measurements: Height: 5\' 9"  (175.3 cm) Weight: 105 lb 6.1 oz (47.8 kg) IBW/kg (Calculated) : 70.7  Vital Signs: Temp: 100 F (37.8 C) (01/03 0754) Temp Source: Axillary (01/03 0754) BP: 116/75 (01/03 0754) Intake/Output from previous day: 01/02 0701 - 01/03 0700 In: 5514.7 [I.V.:1266.2; MA/UQ:3335.4; IV Piggyback:500] Out: 402 [Urine:402] Intake/Output from this shift: No intake/output data recorded.  Labs: Recent Labs    10/10/18 0357  CREATININE 0.48*  ALBUMIN 2.3*  PROT 5.9*  AST 85*  ALT 188*  ALKPHOS 110  BILITOT 0.2*   Estimated Creatinine Clearance: 92.9 mL/min (A) (by C-G formula based on SCr of 0.48 mg/dL (L)).   Microbiology: Recent Results (from the past 720 hour(s))  MRSA PCR Screening     Status: None   Collection Time: 09/25/18 12:50 PM  Result Value Ref Range Status   MRSA by PCR NEGATIVE NEGATIVE Final    Comment:        The GeneXpert MRSA Assay (FDA approved for NASAL specimens only), is one component of a comprehensive MRSA colonization surveillance program. It is not intended to diagnose MRSA infection nor to guide or monitor treatment for MRSA infections. Performed at [facility], [address], [place], [place] [address]   Culture, Urine     Status: None   Collection Time: 09/26/18  2:10 PM  Result Value Ref Range Status   Specimen Description URINE, CLEAN CATCH  Final   Special Requests   Final    NONE Performed at [facility], [address] [place], [place] [address]    Culture   Final    Multiple bacterial morphotypes present, none predominant. Suggest appropriate recollection if clinically indicated.   Report Status 09/27/2018 FINAL  Final  Culture, blood (routine x 2)     Status: None   Collection Time:  09/27/18  2:20 PM  Result Value Ref Range Status   Specimen Description BLOOD RIGHT HAND  Final   Special Requests AEROBIC BOTTLE ONLY Blood Culture adequate volume  Final   Culture   Final    NO GROWTH 5 DAYS Performed at [facility], [address] [place], [place] [address]    Report Status 10/02/2018 FINAL  Final  Culture, blood (routine x 2)     Status: None   Collection Time: 09/27/18  2:25 PM  Result Value Ref Range Status   Specimen Description BLOOD RIGHT HAND  Final   Special Requests   Final    AEROBIC BOTTLE ONLY Blood Culture results may not be optimal due to an excessive volume of blood received in culture bottles   Culture   Final    NO GROWTH 5 DAYS Performed at [facility], [address] [place], [place] [address]    Report Status 10/02/2018 FINAL  Final  Culture, Urine     Status: None   Collection Time: 10/04/18  6:59 PM  Result Value Ref Range Status   Specimen Description URINE, RANDOM  Final   Special Requests NONE  Final   Culture   Final    NO GROWTH Performed at [facility], [address] [place], [place] [address]    Report Status 10/07/2018 FINAL  Final  Culture, blood (Routine X 2) w Reflex to ID  Panel     Status: None   Collection Time: 10/04/18  8:13 PM  Result Value Ref Range Status   Specimen Description BLOOD LEFT ANTECUBITAL  Final   Special Requests   Final    BOTTLES DRAWN AEROBIC AND ANAEROBIC Blood Culture adequate volume   Culture   Final    NO GROWTH 5 DAYS Performed at [facility], [address], [place], [place] [address]    Report Status 10/09/2018 FINAL  Final  Culture, blood (Routine X 2) w Reflex to ID Panel     Status: None   Collection Time: 10/04/18  8:18 PM  Result Value Ref Range Status   Specimen Description BLOOD LEFT WRIST  Final   Special Requests   Final    BOTTLES DRAWN AEROBIC AND ANAEROBIC Blood Culture adequate volume   Culture   Final    NO GROWTH 5 DAYS Performed at [facility], [address], [place], [place] [address]    Report Status 10/09/2018 FINAL  Final    Medical History: Past Medical History:  Diagnosis Date  . Leigh syndrome (HCC)   . Mitochondrial Leigh syndrome   . Pressure ulcer   . Sacral decubitus ulcer, stage II (HCC) 10/08/2018    Medications:  Medications Prior to Admission  Medication Sig Dispense Refill Last Dose  . B Complex-Biotin-FA (B COMPLETE) TABS Take 1 tablet by mouth daily.   09/23/2018  . ferrous sulfate 325 (65 FE) MG EC tablet Take 325 mg by mouth daily with breakfast.   09/23/2018  . LevOCARNitine (L-CARNITINE) 250 MG CAPS Take 250 mg by mouth daily.   09/23/2018  . polyethylene glycol (MIRALAX / GLYCOLAX) packet Take 17 g by mouth every other day.   09/23/2018    Assessment: 28 year old male with history of cerebral palsy, mitochondrial Leigh syndrome, pressure ulcers, feeding tube transferred to [facility] after presenting with fevers and uncontrollable RUE movements concerning for seizures. Nurse reported today patient had an episode of eye movement back and forth and twitching of upper extremities. Pharmacy consulted for phenytoin maintenance dosing, also continues on Keppra and Vimpat.  Patient's initial levels were elevated on 100 Q8H with level drawn appropriately. Dose was held x1 and resumed 25% lower dose of 75mg  Q8H.  Repeat total phenytoin level today is low at 2.8 with albumin 2.3, so corrected level is 5 (Goal 10-20). Note that phenytoin was just changed from IV to per tube on 1/1 which may impact level due to binding of drug. SCr wnl/stable, LFTs mildly elevated.   Goal of Therapy:  Phenytoin level 10-20 mcg/ml  Plan:  Increase Phenytoin to 100mg  QAM and 75mg  QPM + QHS Recheck Phenytoin level and albumin in 5-7 days Monitor for seizures or toxicity  Thank you for allowing pharmacy to be a part of this patient's care.  Link Snuffer, PharmD, BCPS, BCCCP Clinical Pharmacist Please refer to [facility]  for [facility] Pharm[facility] numbers 10/10/2018 10:15 AM

## 2018-10-10 NOTE — Care Management Note (Signed)
Case Management Note  Patient Details  Name: Jason Glover. MRN: [id number] Date of Birth: July 15, 1991  Subjective/Objective:   Pt admitted on 09/25/18 with seizure activity and fevers.  Pt with hx of CP and mitochondrial leigh syndrome.  He is normally communicative and able to use a motorized WC, but has not spoken since the seizures.  Pt from [place], [place] in [place].  Action/Plan: Met with mother to discuss home needs.  PEG tube replaced today, as it was displaced en route to [facility].  Mom states pt has hospital bed, WC and receives bolus feeds 4 times/ day.  Pt was able to eat prior to this hospitalization. Bedside RN to order dietician consult to assess caloric need for bolus vs nocturnal feeds, since pt unable to eat now.  Mom states she is in need of hoyer lift for home use.  Pt will need ambulance transport back to [facility] [employer]; RN Case Manager will arrange.   Expected Discharge Date:                  Expected Discharge Plan:  [place]  In-House Referral:     Discharge planning Services  CM Consult  Post Acute Care Choice:    Choice offered to:     DME Arranged:    DME Agency:     HH Arranged:    [place] Agency:     Status of Service:  In process, will continue to follow  If discussed at Long Length of Stay Meetings, dates discussed:    Additional Comments:  10/10/17 J. Rhyan Radler, RN, BSN Noted pt reaching medical stability; PEG placed and in use.  Dietician recommending possible transition to nocturnal feeds prior to dc to ensure tolerance.   Recommend HHRN for home to assist with PEG and wound care.  Mom also requesting hoyer lift for home use; may need to provide mother with written Rx for lift that she may take to medical equipment store in [place] where pt lives.  MD: pt will also need orders for tube feeding for home use.    Reinaldo Raddle, RN, BSN  Trauma/Neuro ICU Case Manager [phone]

## 2018-10-11 ENCOUNTER — Inpatient Hospital Stay (HOSPITAL_COMMUNITY): Payer: Medicaid Other

## 2018-10-11 LAB — COMPREHENSIVE METABOLIC PANEL
ALT: 202 U/L — ABNORMAL HIGH (ref 0–44)
ALT: 204 U/L — ABNORMAL HIGH (ref 0–44)
AST: 84 U/L — ABNORMAL HIGH (ref 15–41)
AST: 93 U/L — ABNORMAL HIGH (ref 15–41)
Albumin: 2.4 g/dL — ABNORMAL LOW (ref 3.5–5.0)
Albumin: 2.3 g/dL — ABNORMAL LOW (ref 3.5–5.0)
Alkaline Phosphatase: 107 U/L (ref 38–126)
Alkaline Phosphatase: 113 U/L (ref 38–126)
Anion gap: 8 (ref 5–15)
Anion gap: 9 (ref 5–15)
BILIRUBIN TOTAL: 0.3 mg/dL (ref 0.3–1.2)
BUN: 19 mg/dL (ref 6–20)
BUN: 25 mg/dL — ABNORMAL HIGH (ref 6–20)
CO2: 19 mmol/L — ABNORMAL LOW (ref 22–32)
CO2: 16 mmol/L — ABNORMAL LOW (ref 22–32)
Calcium: 8.6 mg/dL — ABNORMAL LOW (ref 8.9–10.3)
Calcium: 8.5 mg/dL — ABNORMAL LOW (ref 8.9–10.3)
Chloride: 112 mmol/L — ABNORMAL HIGH (ref 98–111)
Chloride: 112 mmol/L — ABNORMAL HIGH (ref 98–111)
Creatinine, Ser: 0.39 mg/dL — ABNORMAL LOW (ref 0.61–1.24)
Creatinine, Ser: 0.46 mg/dL — ABNORMAL LOW (ref 0.61–1.24)
GFR calc Af Amer: >60 mL/min (ref >60)
GFR calc non Af Amer: >60 mL/min (ref >60)
GFR calc non Af Amer: >60 mL/min (ref >60)
Glucose, Bld: 87 mg/dL (ref 70–99)
Glucose, Bld: 119 mg/dL — ABNORMAL HIGH (ref 70–99)
Potassium: 4.2 mmol/L (ref 3.5–5.1)
Potassium: 3.5 mmol/L (ref 3.5–5.1)
Sodium: 139 mmol/L (ref 135–145)
Sodium: 137 mmol/L (ref 135–145)
Total Bilirubin: 0.4 mg/dL (ref 0.3–1.2)
Total Protein: 6 g/dL — ABNORMAL LOW (ref 6.5–8.1)
Total Protein: 6 g/dL — ABNORMAL LOW (ref 6.5–8.1)

## 2018-10-11 LAB — TROPONIN I: Troponin I: <0.03 ng/mL (ref <0.03)

## 2018-10-11 LAB — GLUCOSE, CAPILLARY
GLUCOSE-CAPILLARY: 121 mg/dL — AB (ref 70–99)
GLUCOSE-CAPILLARY: 82 mg/dL (ref 70–99)
Glucose-Capillary: 109 mg/dL — ABNORMAL HIGH (ref 70–99)
Glucose-Capillary: 93 mg/dL (ref 70–99)
Glucose-Capillary: 101 mg/dL — ABNORMAL HIGH (ref 70–99)
Glucose-Capillary: 108 mg/dL — ABNORMAL HIGH (ref 70–99)

## 2018-10-11 MED ORDER — JEVITY 1.2 CAL PO LIQD
474.0000 mL | Freq: Four times a day (QID) | ORAL | Status: DC
  Administered 2018-10-11 – 2018-10-13 (×6): 474 mL
  Filled 2018-10-11 (×9): qty 474

## 2018-10-11 MED ORDER — METOPROLOL TARTRATE 5 MG/5ML IV SOLN
5.0000 mg | Freq: Once | INTRAVENOUS | Status: AC
  Administered 2018-10-11: 5 mg via INTRAVENOUS

## 2018-10-11 MED ORDER — JEVITY 1.2 CAL PO LIQD
237.0000 mL | Freq: Four times a day (QID) | ORAL | Status: DC
  Filled 2018-10-11 (×2): qty 237

## 2018-10-11 MED ORDER — FREE WATER
150.0000 mL | Freq: Three times a day (TID) | Status: DC
  Administered 2018-10-11 – 2018-10-17 (×16): 150 mL

## 2018-10-11 NOTE — Progress Notes (Signed)
Neurology Progress Note   S:// Recalled for reevaluation. LFTs are deranged. No other obvious source per medicine team Need recommendation on antiepileptics and whether they might be contributing to deranged renal function. Per his mother, he is nearly at his baseline.  O:// Current vital signs: BP 114/68   Pulse (!) 122   Temp 97.9 F (36.6 C) (Axillary)   Resp 14   Ht 5\' 9"  (1.753 m)   Wt 47.8 kg   SpO2 98%   BMI 15.56 kg/m  Vital signs in last 24 hours: Temp:  [97.9 F (36.6 C)-100.6 F (38.1 C)] 97.9 F (36.6 C) (01/04 0700) Pulse Rate:  [105-123] 122 (01/04 0936) Resp:  [14-19] 14 (01/04 0700) BP: (96-117)/(62-84) 114/68 (01/04 0936) SpO2:  [98 %-100 %] 98 % (01/04 0700)  General: Patient is very somnolent difficult to arouse but the mother says that he had been up all night and he is sleeping and when he sleeps he sleeps like this. HEENT: Normocephalic atraumatic CVS: Respiratory regular rhythm Neurological exam Patient is sleeping, difficult to arouse and actually resists eye opening when attempted. Incomplete evaluation.  Will complete when he is more cooperative.  Medications  Current Facility-Administered Medications:  .  0.9 %  sodium chloride infusion, , Intravenous, PRN, Meredeth Ide, MD, Last Rate: 10 mL/hr at 10/09/18 0920 .  acetaminophen (TYLENOL) solution 650 mg, 650 mg, Per Tube, Q6H PRN, Schorr, Roma Kayser, NP, 650 mg at 10/10/18 2032 .  acetaminophen (TYLENOL) suppository 650 mg, 650 mg, Rectal, Q6H PRN, Noralee Stain, DO, 650 mg at 10/04/18 2146 .  diphenhydrAMINE (BENADRYL) 12.5 MG/5ML elixir 25 mg, 25 mg, Per Tube, Q6H PRN, Standley Brooking, MD, 25 mg at 10/10/18 0948 .  feeding supplement (JEVITY 1.2 CAL) liquid 1,000 mL, 1,000 mL, Per Tube, Continuous, Standley Brooking, MD, Last Rate: 80 mL/hr at 10/10/18 1743, 1,000 mL at 10/10/18 1743 .  free water 30 mL, 30 mL, Per Tube, Q4H, Choi, Jennifer, DO, 30 mL at 10/11/18 0730 .  lacosamide  (VIMPAT) tablet 200 mg, 200 mg, Per Tube, BID, Standley Brooking, MD, 200 mg at 10/11/18 0940 .  levETIRAcetam (KEPPRA) 100 MG/ML solution 1,500 mg, 1,500 mg, Per Tube, BID, Standley Brooking, MD, 1,500 mg at 10/11/18 0936 .  levOCARNitine (CARNITOR) 1 GM/10ML solution 250 mg, 250 mg, Per Tube, Daily, Milon Dikes, MD, 250 mg at 10/11/18 0940 .  lidocaine (XYLOCAINE) 2 % viscous mouth solution, , Mouth/Throat, PRN, Richarda Overlie, MD, 15 mL at 10/07/18 1428 .  loperamide HCl (IMODIUM) 1 MG/7.5ML suspension 2 mg, 2 mg, Per Tube, PRN, Noralee Stain, DO .  metoprolol tartrate (LOPRESSOR) 25 mg/10 mL oral suspension 12.5 mg, 12.5 mg, Per Tube, BID, Standley Brooking, MD, 12.5 mg at 10/11/18 9191 .  nutrition supplement (JUVEN) (JUVEN) powder packet 1 packet, 1 packet, Per Tube, BID BM, Noralee Stain, DO, 1 packet at 10/11/18 [phone] .  phenytoin (DILANTIN) 125 MG/5ML suspension 100 mg, 100 mg, Per Tube, Daily, 100 mg at 10/11/18 0939 **AND** phenytoin (DILANTIN) 125 MG/5ML suspension 75 mg, 75 mg, Per Tube, BID, Standley Brooking, MD, 75 mg at 10/10/18 2216 Labs CBC    Component Value Date/Time   WBC 14.2 (H) 10/07/2018 0700   RBC 3.70 (L) 10/07/2018 0700   HGB 10.7 (L) 10/07/2018 0700   HCT 32.6 (L) 10/07/2018 0700   PLT 461 (H) 10/07/2018 0700   MCV 88.1 10/07/2018 0700   MCH 28.9 10/07/2018 0700   MCHC 32.8  10/07/2018 0700   RDW 15.9 (H) 10/07/2018 0700   LYMPHSABS 2.1 09/27/2018 0743   MONOABS 1.0 09/27/2018 0743   EOSABS 0.2 09/27/2018 0743   BASOSABS 0.1 09/27/2018 0743    CMP     Component Value Date/Time   NA 139 10/11/2018 0437   K 4.2 10/11/2018 0437   CL 112 (H) 10/11/2018 0437   CO2 19 (L) 10/11/2018 0437   GLUCOSE 87 10/11/2018 0437   BUN 19 10/11/2018 0437   CREATININE 0.39 (L) 10/11/2018 0437   CALCIUM 8.6 (L) 10/11/2018 0437   PROT 6.0 (L) 10/11/2018 0437   ALBUMIN 2.4 (L) 10/11/2018 0437   AST 84 (H) 10/11/2018 0437   ALT 202 (H) 10/11/2018 0437   ALKPHOS 107  10/11/2018 0437   BILITOT 0.3 10/11/2018 0437   GFRNONAA >60 10/11/2018 0437   GFRAA >60 10/11/2018 0437    Imaging I have reviewed images in epic and the results pertinent to this consultation are: MRI done on admission with a left frontal cortical lesion characteristic of Leighg syndrome  Assessment:  28 year old man past history of daily LEIGH syndrome, seizure disorder transferred for fever and uncontrollable right upper extremity movements after misplaced PEG tube-also has been altered since then.  He has been doing well since his admission, no longer febrile, received treatment with antibiotics, treated with 3 antiepileptics Keppra/Vimpat/Dilantin for presumed simple partial seizure that was not caught on EEG. Concern was that this could still reflect a partial status epilepticus followed by prolonged postictal phase. Today was recalled to assess for deranged renal function and whether antiepileptics should be changed. His Dilantin level has been subtherapeutic and I think Dilantin should be discontinued.   Impression LEIGH syndrome Questionable focal status epilepticus Toxic metabolic encephalopathy Possible adverse drug reaction with Dilantin  Recommendations: Discontinue Dilantin Continue thiamine Continue tube feeds For antiepileptics-continue Keppra and Vimpat for now. Monitor liver function test.  If they do not start to trend down with stopping Dilantin, might also consider reducing Vimpat. We will get a spot EEG as these changes are made.  -- Milon Dikes, MD [facility] Pager: [phone] If 7pm to 7am, please call on call as listed on AMION.

## 2018-10-11 NOTE — Significant Event (Signed)
Rapid Response Event Note  Overview: Cardiac - VT  Initial Focused Assessment: Called by RN about patient having VT on the monitor, per RN, an EKG was being done. When I arrived, EKG - resulted STEMI. HR was in the 110-130s ST, there are some ST changes on telemetry and on the EKG but the ST changes do not seem consistent. [facility] NP Bodenheimer was on the phone, [facility] NP Blount will come assess patient. Neuro status remains at baseline, BP and oxygen saturations are normal. Patient does have some tremors and is on anti-seizure medications. [facility] NP came to bedside, per review chart, seizure medications were changed today, this could be seizure activity.  Interventions: -- STAT CMP and Troponin  Plan of Care: -- RN to give schedule anti-seizure medications -- RN to page [facility] NP when labs are complete.  -- I talk to the RN around 2300 and patient's HR had improved and overall patient was back to baseline. Troponin was negative.  Event Summary:    at    Call Time 2025 Arrival Time 2027 End Time 2105  Jason Glover R

## 2018-10-11 NOTE — Progress Notes (Signed)
EEG Completed; Results Pending  

## 2018-10-11 NOTE — Progress Notes (Signed)
PROGRESS NOTE    Jason Heaterhaddeus R Daoud Jr.   WUJ:[id number]  DOB: 1991/09/27  DOA: 09/25/2018 PCP: [name], [name], MD   Brief Narrative:  Jason Heaterhaddeus R Lipkin Jr. 28 year old man PMH mitochondrial Leigh syndrome, pressure ulcers, feeding tubes placement, transferred from [facility] after presenting with acute encephalopathy, fever and uncontrollable movements right upper extremity.  At baseline able to communicate uses a motorized wheelchair.  Admitted for further evaluation and treatment of focal status epilepticus and fever.  Seen by neurology, stat EEG showed no epileptiform discharges and patient loaded with Vimpat.  Patient followed by neurology, long-term EEG was negative but cannot rule out focal status epilepticus.  Despite being no longer febrile, being treated with antibiotics and having extensive work-up as well as consultation with infectious disease in 3 antiepileptics as per neurology there is been no significant change in his condition.   Subjective: Having occasional jerking movements per Mom. No vomiting, signs of abdominal pain or diarrhea per Mom. He is non verbal with me.     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  Initial EEG as well as long-term EEG showed no evidence of seizures but can miss simple partial status.  Started on thiamine per neurology.  MRI brain showed restricted diffusion left frontal cortex which could represent ongoing seizures.  However neurology recommended no change to antiepileptics as there was no EEG correlate. Treated with thiamine 500 mg 3 times daily. --Seems improved today, following some commands per nursing, no significant seizure activity noted. --he is receiving Dilantin, Vimpat, Keppra --Avoid valproic acid given mitochondrial disease --Follow-up with Dr. Sharene SkeansHickling on discharge --Use diazepam for seizures - as LFTS are still elevated and there are no GI signs or  symptoms, I asked for a neuro opinion on whether his medications need to be changed. Neuro has held Dilantin for now- follow LFTs over the next couple of days to look for improvement- plans for EEG as well by neuro  Elevated transaminases --New.  Doubt secondary to tube feeds as the patient has been on long-term tube feeds.   --see above  Severe malnutrition --Continue PEG tube feeds which he is tolearting well - nutrition recommended transition to Bolus feeds once he was tolerating continuous feeds- will order this today  Fever unknown origin. On admission treated empirically with acyclovir, aztreonam and vancomycin initially.  Mother declined lumbar puncture.  Chest x-ray and urinalysis negative.  Influenza negative.  Subsequently antibiotics were stopped, then had intermittent fever, prompting further antibiotics, eventually prompting infectious disease consultation.   --Low-grade temperature.  No true fever since 12/28.   --Prior blood cultures no growth, repeat blood cultures no growth.  Urinalysis and chest x-ray negative.  No respiratory distress.  Differential felt to be antiepileptics or other drugs or central fever from Leigh syndrome demyelinization.  Infectious disease recommended stopping antibiotics 1/1 and monitoring temperature curve. -- He had a temp of 100.6 yesterday- continue to follow  Sinus tachycardia  --Present for approximately last 12 days of hospitalization. --EKG showed sinus tachycardia, no acute changes --Continue metoprolol  Mitochondrial Leigh syndrome --Continue levocarnitine per tube  PEG tube malfunction on admission 12/19. Exchanged 12/31.    Angioedema.  Resolved.  Developed 12/27 with upper lip, right earlobe edema after receiving IV Ativan. --Resolved.  Ativan was stopped.  Treated with steroids and Benadryl.  Stage II left trochanter pressure ulcer, stage I ulcers left lateral malleolus, right foot --Continue wound care per  RN   Continue tube feeds.  Repeat LFTs in a.m.  Once LFTs stable, likely discharge home.  Time spent in minutes: 35 DVT prophylaxis: SCDs Code Status: Full code Family Communication: Mother at bedside Disposition Plan: home Consultants:   neuro Procedures:   EEG  Cortrak tube placement 12/23  PEG tube exchanged on 12/31 Antimicrobials:  Anti-infectives (From admission, onward)   Start     Dose/Rate Route Frequency Ordered Stop   10/07/18 1500  vancomycin (VANCOCIN) IVPB 750 mg/150 ml premix  Status:  Discontinued     750 mg 150 mL/hr over 60 Minutes Intravenous Every 12 hours 10/07/18 1426 10/08/18 1305   10/05/18 0400  vancomycin (VANCOCIN) IVPB 750 mg/150 ml premix  Status:  Discontinued     750 mg 150 mL/hr over 60 Minutes Intravenous Every 8 hours 10/04/18 1946 10/07/18 1426   10/04/18 2000  vancomycin (VANCOCIN) IVPB 1000 mg/200 mL premix     1,000 mg 200 mL/hr over 60 Minutes Intravenous  Once 10/04/18 1946 10/04/18 2200   10/04/18 2000  meropenem (MERREM) 1 g in sodium chloride 0.9 % 100 mL IVPB  Status:  Discontinued     1 g 200 mL/hr over 30 Minutes Intravenous Every 8 hours 10/04/18 1946 10/08/18 1305   09/29/18 1400  meropenem (MERREM) 1 g in sodium chloride 0.9 % 100 mL IVPB     1 g 200 mL/hr over 30 Minutes Intravenous Every 8 hours 09/29/18 1306 10/02/18 2130   09/26/18 1100  vancomycin (VANCOCIN) 500 mg in sodium chloride 0.9 % 100 mL IVPB  Status:  Discontinued     500 mg 100 mL/hr over 60 Minutes Intravenous Every 8 hours 09/26/18 1015 09/28/18 1224   09/25/18 1700  vancomycin (VANCOCIN) IVPB 1000 mg/200 mL premix     1,000 mg 200 mL/hr over 60 Minutes Intravenous  Once 09/25/18 1608 09/25/18 2000   09/25/18 1700  aztreonam (AZACTAM) 2 g in sodium chloride 0.9 % 100 mL IVPB  Status:  Discontinued     2 g 200 mL/hr over 30 Minutes Intravenous Every 8 hours 09/25/18 1608 09/29/18 1301   09/25/18 1700  acyclovir (ZOVIRAX) 450 mg in dextrose 5 % 100 mL  IVPB  Status:  Discontinued     450 mg 109 mL/hr over 60 Minutes Intravenous Every 8 hours 09/25/18 1608 09/29/18 1301       Objective: Vitals:   10/11/18 0400 10/11/18 0700 10/11/18 0936 10/11/18 1100  BP: 117/84  114/68   Pulse: (!) 109 (!) 108 (!) 122   Resp: 17 14    Temp: 98.9 F (37.2 C) 97.9 F (36.6 C)  99.7 F (37.6 C)  TempSrc: Oral Axillary  Oral  SpO2: 99% 98%    Weight:      Height:        Intake/Output Summary (Last 24 hours) at 10/11/2018 1400 Last data filed at 10/11/2018 1100 Gross per 24 hour  Intake 945.5 ml  Output 1400 ml  Net -454.5 ml   Filed Weights   09/25/18 1245  Weight: 47.8 kg    Examination: General exam: Appears comfortable  HEENT: PERRL  Respiratory system: Clear to auscultation. Respiratory effort normal. Cardiovascular system: S1 & S2 heard, RRR.  Has mild tachycardia Gastrointestinal system: Abdomen soft, non-tender, nondistended. Normal bowel sounds. Central nervous system: Alert - does not talk to me- extremities are contracted Extremities: No cyanosis, clubbing or edema Skin: No rashes or ulcers Psychiatry:  Cannot assess    Data Reviewed: I have personally reviewed following labs and imaging studies  CBC: Recent Labs  Lab 10/04/18 2018 10/07/18 0700  WBC 13.2* 14.2*  HGB 11.4* 10.7*  HCT 34.9* 32.6*  MCV 85.7 88.1  PLT 462* 461*   Basic Metabolic Panel: Recent Labs  Lab 10/07/18 0700 10/10/18 0357 10/11/18 0437  NA 138 137 139  K 4.1 4.2 4.2  CL 106 108 112*  CO2 21* 20* 19*  GLUCOSE 105* 108* 87  BUN 23* 24* 19  CREATININE 0.48* 0.48* 0.39*  CALCIUM 9.0 8.6* 8.6*   GFR: Estimated Creatinine Clearance: 92.9 mL/min (A) (by C-G formula based on SCr of 0.39 mg/dL (L)). Liver Function Tests: Recent Labs  Lab 10/10/18 0357 10/11/18 0437  AST 85* 84*  ALT 188* 202*  ALKPHOS 110 107  BILITOT 0.2* 0.3  PROT 5.9* 6.0*  ALBUMIN 2.3* 2.4*   No results for input(s): LIPASE, AMYLASE in the last 168  hours. No results for input(s): AMMONIA in the last 168 hours. Coagulation Profile: No results for input(s): INR, PROTIME in the last 168 hours. Cardiac Enzymes: No results for input(s): CKTOTAL, CKMB, CKMBINDEX, TROPONINI in the last 168 hours. BNP (last 3 results) No results for input(s): PROBNP in the last 8760 hours. HbA1C: No results for input(s): HGBA1C in the last 72 hours. CBG: Recent Labs  Lab 10/10/18 1209 10/10/18 1740 10/11/18 0357 10/11/18 0723 10/11/18 1152  GLUCAP 101* 100* 109* 93 121*   Lipid Profile: No results for input(s): CHOL, HDL, LDLCALC, TRIG, CHOLHDL, LDLDIRECT in the last 72 hours. Thyroid Function Tests: No results for input(s): TSH, T4TOTAL, FREET4, T3FREE, THYROIDAB in the last 72 hours. Anemia Panel: No results for input(s): VITAMINB12, FOLATE, FERRITIN, TIBC, IRON, RETICCTPCT in the last 72 hours. Urine analysis:    Component Value Date/Time   COLORURINE YELLOW 10/06/2018 1025   APPEARANCEUR CLEAR 10/06/2018 1025   LABSPEC 1.015 10/06/2018 1025   PHURINE 8.0 10/06/2018 1025   GLUCOSEU NEGATIVE 10/06/2018 1025   HGBUR NEGATIVE 10/06/2018 1025   BILIRUBINUR NEGATIVE 10/06/2018 1025   KETONESUR NEGATIVE 10/06/2018 1025   PROTEINUR NEGATIVE 10/06/2018 1025   NITRITE NEGATIVE 10/06/2018 1025   LEUKOCYTESUR NEGATIVE 10/06/2018 1025   Sepsis Labs: @LABRCNTIP (procalcitonin:4,lacticidven:4) ) Recent Results (from the past 240 hour(s))  Culture, Urine     Status: None   Collection Time: 10/04/18  6:59 PM  Result Value Ref Range Status   Specimen Description URINE, RANDOM  Final   Special Requests NONE  Final   Culture   Final    NO GROWTH Performed at [facility], [address], [place], [place] [address]    Report Status 10/07/2018 FINAL  Final  Culture, blood (Routine X 2) w Reflex to ID Panel     Status: None   Collection Time: 10/04/18  8:13 PM  Result Value Ref Range Status   Specimen Description BLOOD LEFT ANTECUBITAL   Final   Special Requests   Final    BOTTLES DRAWN AEROBIC AND ANAEROBIC Blood Culture adequate volume   Culture   Final    NO GROWTH 5 DAYS Performed at [facility], [address], [place], [place] [address]    Report Status 10/09/2018 FINAL  Final  Culture, blood (Routine X 2) w Reflex to ID Panel     Status: None   Collection Time: 10/04/18  8:18 PM  Result Value Ref Range Status   Specimen Description BLOOD LEFT WRIST  Final   Special Requests   Final    BOTTLES DRAWN AEROBIC AND ANAEROBIC Blood Culture  adequate volume   Culture   Final    NO GROWTH 5 DAYS Performed at [facility], [address] [place], [place] [address]    Report Status 10/09/2018 FINAL  Final         Radiology Studies: No results found.    Scheduled Meds: . free water  30 mL Per Tube Q4H  . lacosamide  200 mg Per Tube BID  . levETIRAcetam  1,500 mg Per Tube BID  . levOCARNitine  250 mg Per Tube Daily  . metoprolol tartrate  12.5 mg Per Tube BID  . nutrition supplement (JUVEN)  1 packet Per Tube BID BM   Continuous Infusions: . sodium chloride 10 mL/hr at 10/11/18 1135  . feeding supplement (JEVITY 1.2 CAL) 1,000 mL (10/10/18 1743)     LOS: 16 days      Calvert CantorSaima Harriet Bollen, MD [facility] Pager: [email or web address] Password [email or web address] 10/11/2018, 2:00 PM

## 2018-10-11 NOTE — Procedures (Signed)
History: 28 yo M with leigh syndrome   Sedation: None  Technique: This is a 21 channel routine scalp EEG performed at the bedside with bipolar and monopolar montages arranged in accordance to the international 10/20 system of electrode placement. One channel was dedicated to EKG recording.    Background: There is a posterior dominant rhythm of 8 - 9 Hz which is poorly sustained. The background is predominated, however, by generalized irregular delta and theta activities. Sleep is not recorded. There are occasional runs of frontally predominant intermittent rhythmic delta activity(FIRDA).  Photic stimulation: Physiologic driving is not performed  EEG Abnormalities: 1) FIRDA 2) Generalized irregular slow activity  Clinical Interpretation: This EEG is conssistent with a generalized non-specific cerebral dysfunction(encephalopathy). There was no seizure or seizure predisposition recorded on this study. Please note that lack of epileptiform activity on EEG does not preclude the possibility of epilepsy.   Ritta Slot, MD [facility] [phone]  If 7pm- 7am, please page neurology on call as listed in AMION.

## 2018-10-12 ENCOUNTER — Inpatient Hospital Stay (HOSPITAL_COMMUNITY): Payer: Medicaid Other

## 2018-10-12 DIAGNOSIS — L89003 Pressure ulcer of unspecified elbow, stage 3: Secondary | ICD-10-CM

## 2018-10-12 LAB — GLUCOSE, CAPILLARY
Glucose-Capillary: 89 mg/dL (ref 70–99)
Glucose-Capillary: 99 mg/dL (ref 70–99)
Glucose-Capillary: 106 mg/dL — ABNORMAL HIGH (ref 70–99)
Glucose-Capillary: 118 mg/dL — ABNORMAL HIGH (ref 70–99)
Glucose-Capillary: 90 mg/dL (ref 70–99)
Glucose-Capillary: 92 mg/dL (ref 70–99)

## 2018-10-12 MED ORDER — DIAZEPAM 5 MG/ML IJ SOLN: 5.0000 mg | Freq: Once | INTRAMUSCULAR | Status: DC | PRN

## 2018-10-12 NOTE — Progress Notes (Signed)
Neurology Progress Note   S:// Patient apparently had a seizure-like spell associated with arrhythmia at about 8:30 PM yesterday.  There was alternating sinus tachycardia and V. tach on the monitor with a heart rate of 130. IV metoprolol was used to bring the heart rate back to 115's. He continues to be tachycardic. No active twitching right now but overnight had occasional single twitches of right upper extremity happening every 2 minutes. There was some concern of his Vimpat dose not being administered yesterday morning as it was partially crushed in the pillbox. EEG yesterday showed FI RDA but no electrographic seizures EEG has been negative electrographic seizures before also, when he had the right sided jerking.  O:// Current vital signs: BP 121/77 (BP Location: Left Arm)   Pulse (!) 110   Temp 98.5 F (36.9 C) (Oral)   Resp 20   Ht 5\' 9"  (1.753 m)   Wt 47.8 kg   SpO2 100%   BMI 15.56 kg/m  Vital signs in last 24 hours: Temp:  [96.2 F (35.7 C)-99.8 F (37.7 C)] 98.5 F (36.9 C) (01/05 0400) Pulse Rate:  [106-136] 110 (01/05 0400) Resp:  [13-20] 20 (01/05 0400) BP: (110-121)/(68-82) 121/77 (01/05 0400) SpO2:  [97 %-100 %] 100 % (01/05 0400) General: Patient was sleeping, opens eyes to voice. HEENT: Normocephalic atraumatic CVS: Tachycardic Respiratory: Breathing normally saturating well on room air Neurological exam He is awake, alert does not follow commands. Cranial nerves: Pupils equal round react light, extraocular movements intact, no forced gaze deviation or gaze preference, face symmetric. Motor exam: Increased tone in all fours with no jerking movements noticed today. Cannot assess coordination. Grimaces to noxious stimulation in all fours and attempts to withdraw but no strong withdrawal.  Medications  Current Facility-Administered Medications:  .  0.9 %  sodium chloride infusion, , Intravenous, PRN, Meredeth IdeLama, Gagan S, MD, Last Rate: 10 mL/hr at 10/11/18  1135 .  acetaminophen (TYLENOL) solution 650 mg, 650 mg, Per Tube, Q6H PRN, Schorr, Roma KayserKatherine P, NP, 650 mg at 10/11/18 1956 .  acetaminophen (TYLENOL) suppository 650 mg, 650 mg, Rectal, Q6H PRN, Noralee StainChoi, Jennifer, DO, 650 mg at 10/04/18 2146 .  diphenhydrAMINE (BENADRYL) 12.5 MG/5ML elixir 25 mg, 25 mg, Per Tube, Q6H PRN, Standley BrookingGoodrich, Daniel P, MD, 25 mg at 10/10/18 0948 .  feeding supplement (JEVITY 1.2 CAL) liquid 474 mL, 474 mL, Per Tube, QID, Rizwan, Saima, MD, 474 mL at 10/11/18 2106 .  free water 150 mL, 150 mL, Per Tube, Q8H, Rizwan, Saima, MD, 150 mL at 10/11/18 2230 .  lacosamide (VIMPAT) tablet 200 mg, 200 mg, Per Tube, BID, Standley BrookingGoodrich, Daniel P, MD, 200 mg at 10/11/18 2100 .  levETIRAcetam (KEPPRA) 100 MG/ML solution 1,500 mg, 1,500 mg, Per Tube, BID, Standley BrookingGoodrich, Daniel P, MD, 1,500 mg at 10/11/18 2106 .  levOCARNitine (CARNITOR) 1 GM/10ML solution 250 mg, 250 mg, Per Tube, Daily, Milon DikesArora, Cristyn Crossno, MD, 250 mg at 10/11/18 0940 .  lidocaine (XYLOCAINE) 2 % viscous mouth solution, , Mouth/Throat, PRN, Richarda OverlieHenn, Adam, MD, 15 mL at 10/07/18 1428 .  loperamide HCl (IMODIUM) 1 MG/7.5ML suspension 2 mg, 2 mg, Per Tube, PRN, Noralee Stainhoi, Jennifer, DO .  metoprolol tartrate (LOPRESSOR) 25 mg/10 mL oral suspension 12.5 mg, 12.5 mg, Per Tube, BID, Standley BrookingGoodrich, Daniel P, MD, 12.5 mg at 10/11/18 2106 .  nutrition supplement (JUVEN) (JUVEN) powder packet 1 packet, 1 packet, Per Tube, BID BM, Noralee Stainhoi, Jennifer, DO, 1 packet at 10/11/18 1507 Labs CBC    Component Value Date/Time  WBC 14.2 (H) 10/07/2018 0700   RBC 3.70 (L) 10/07/2018 0700   HGB 10.7 (L) 10/07/2018 0700   HCT 32.6 (L) 10/07/2018 0700   PLT 461 (H) 10/07/2018 0700   MCV 88.1 10/07/2018 0700   MCH 28.9 10/07/2018 0700   MCHC 32.8 10/07/2018 0700   RDW 15.9 (H) 10/07/2018 0700   LYMPHSABS 2.1 09/27/2018 0743   MONOABS 1.0 09/27/2018 0743   EOSABS 0.2 09/27/2018 0743   BASOSABS 0.1 09/27/2018 0743    CMP     Component Value Date/Time   NA 137  10/11/2018 2101   K 3.5 10/11/2018 2101   CL 112 (H) 10/11/2018 2101   CO2 16 (L) 10/11/2018 2101   GLUCOSE 119 (H) 10/11/2018 2101   BUN 25 (H) 10/11/2018 2101   CREATININE 0.46 (L) 10/11/2018 2101   CALCIUM 8.5 (L) 10/11/2018 2101   PROT 6.0 (L) 10/11/2018 2101   ALBUMIN 2.3 (L) 10/11/2018 2101   AST 93 (H) 10/11/2018 2101   ALT 204 (H) 10/11/2018 2101   ALKPHOS 113 10/11/2018 2101   BILITOT 0.4 10/11/2018 2101   GFRNONAA >60 10/11/2018 2101   GFRAA >60 10/11/2018 2101  No new brain imaging  Assessment:  28 year old man past history of Leigh syndrome mitochondrial disorder, transferred after he was septic and had misplaced PEG tube and started having right upper extremity movements concerning for focal seizure. Started on 3 antiepileptics and the movements resolved.  There was no EEG correlate of those upper extremity movements on the LTM EEG prior. Overnight, Dilantin was discontinued because of deranged liver function. Around 8:30 PM, he had some right-sided twitching which was also associated with arrhythmia and sinus tachycardia on the cardiac monitor. No extra doses of medication or benzos were required. I am not sure whether these were seizures or some other kind of abnormal movements but given the fact that he has had focal seizures and responded well to antiepileptics, I would favor watching him for a longer time on the EEG and then making any changes to antiepileptics if needed.  Recommendations: LTM EEG for at least 24 hours Continue with current doses of Keppra and Vimpat Continue thiamine Continue tube feeds Continue to monitor liver function tests -- Milon Dikes, MD [facility] Pager: [phone] If 7pm to 7am, please call on call as listed on AMION.

## 2018-10-12 NOTE — Plan of Care (Signed)
  Problem: Open Wound Care Goal: Ability to return to baseline activity level will improve Outcome: Progressing   Problem: Pain Management Goal: Ability to return to baseline activity level will improve Description Manage pain with FACES scale  Outcome: Progressing   Problem: Education: Goal: Knowledge of General Education information will improve Description Including pain rating scale, medication(s)/side effects and non-pharmacologic comfort measures Outcome: Progressing   Problem: Spiritual Needs Goal: Ability to function at adequate level Outcome: Progressing   Problem: Health Behavior/Discharge Planning: Goal: Ability to manage health-related needs will improve Outcome: Progressing   Problem: Clinical Measurements: Goal: Ability to maintain clinical measurements within normal limits will improve Outcome: Progressing Goal: Will remain free from infection Outcome: Progressing Goal: Diagnostic test results will improve Outcome: Progressing Goal: Respiratory complications will improve Outcome: Progressing Goal: Cardiovascular complication will be avoided Outcome: Progressing   Problem: Activity: Goal: Risk for activity intolerance will decrease Outcome: Progressing   Problem: Nutrition: Goal: Adequate nutrition will be maintained Outcome: Progressing   Problem: Coping: Goal: Level of anxiety will decrease Outcome: Progressing   Problem: Elimination: Goal: Will not experience complications related to bowel motility Outcome: Progressing Goal: Will not experience complications related to urinary retention Outcome: Progressing   Problem: Pain Managment: Goal: General experience of comfort will improve Outcome: Progressing   Problem: Safety: Goal: Ability to remain free from injury will improve Outcome: Progressing   Problem: Skin Integrity: Goal: Risk for impaired skin integrity will decrease Outcome: Progressing   

## 2018-10-12 NOTE — Significant Event (Signed)
Significant Event: Notified by nursing that the patient had a seizure like spell with associated arrhythmia at about 8:30 this evening. The arrhythmia looked like alternating sinus tachy and V-tach on the monitor with HR of 130. IV metoprolol brought the HR back to 115. 12-lead EKG showed sinus tachycardia, possible STEMI and lab draw came back with normal troponin. No active twitching, but occasional single twitches of RUE occurring about every 2 minutes. Hospitalist evaluated the patient and nighttime seizure meds were administered. When RN opened pill crusher to administer nighttime Vimpat, there were 4 partially crushed Vimpat pills, suggesting that AM dose had not been given even though it was marked off in the [facility] (since corrected by RN Peach). Night-time doses of Vimpat and Keppra were then administered by RN and he has had no further episodes. EEG today showed FIRDA but no electrographic seizures.  Literature Search: Given that several AEDs may trigger or worsen a mitochonidrial disorde or even be fatal in some cases, a literature search was performed on anticonvulsant use in mitochondrial disorders.  -- Per the literature, the AED with the most well-known mitochondrial toxicity is valproic acid (VPA), and this has already been documented as not an option in our patient.  -- AEDs other than VPA, which may affect the mitochondrial metabolism, include phenobarbital, carbamazepine, phenytoin, oxcarbazepine, ethosuximide, zonisamide, topiramate, gabapentin and vigabatrin.  -- Not on this list but not yet used in this patient is Lamictal, which may be a reasonable option if he needs to be started on a third anticonvulsant or if he needs to have Vimpat decreased should LFTs not trend downward after the recent discontinuation of Dilantin.  -- Benzodiazepines are not a good option given recent episode of lip and ear swelling in this patient after having been given Ativan.   Electronically signed: Dr. Caryl Pina

## 2018-10-12 NOTE — Progress Notes (Signed)
LTM started; notified Dr Dorthea Cove. Educated mom on event button.

## 2018-10-12 NOTE — Progress Notes (Signed)
[facility]                                                                              Patient Demographics  Jason Glover, is a 28 y.o. male, DOB - [birth year], ZOX:[id number]  Admit date - 09/25/2018   Admitting Physician Therisa Doyne, MD  Outpatient Primary MD for the patient is Johny Blamer, MD  Outpatient specialists:   LOS - 17  days   Medical records reviewed and are as summarized below:    No chief complaint on file.      Brief summary   Patient is a 28 year old man PMH mitochondrial Leigh syndrome, pressure ulcers, feeding tubes placement, transferred from [facility] after presenting with acute encephalopathy, fever and uncontrollable movements right upper extremity. At baseline able to communicate uses a motorized wheelchair. Admitted for further evaluation and treatment of focal status epilepticus and fever. Seen by neurology, stat EEG showed no epileptiform discharges and patient loaded with Vimpat. Patient followed by neurology, long-term EEG was negative but cannot rule out focal status epilepticus. Despite being no longer febrile, being treated with antibiotics and having extensive work-up as well as consultation with infectious disease in 3 antiepileptics as per neurology there is been no significant change in his condition.  Assessment & Plan   Principal problem Focal status epilepticus, seizures, acute metabolic encephalopathy with underlying CP. -Initial EG and prolonged EEG showed no evidence of seizures, patient was started on thiamine per neurology -MRI brain showed restricted diffusion left frontal cortex which could represent ongoing seizures. -Overnight had seizure-like spell with associated arrhythmia around 8:30 PM.  Seen by neurology at bedside, son of Vimpat dose not being administered as it was partially crushed in the pillbox a day before. -Per neurology, prolonged EEG, continue current doses of  Keppra and Vimpat, thiamine -Due to transaminitis, Dilantin held by neurology, follow LFTs over the next few days for improvement.  Transaminitis -Holding Dilantin, repeat LFTs in a.m., doubt secondary to tube feeds as patient has been on long-term artificial feeding. -Repeat LFTs in a.m., if worsening, will obtain further work-up.  Severe protein calorie malnutrition -Dietitian following, continue PEG tube feeds  FUO -On admission, treated empirically with acyclovir, aztreonam and vancomycin. -Mother had declined LP, chest x-ray, UA negative, influenza negative, subsequently antibiotics were discontinued.  ID was consulted. -Blood cultures remain negative, felt possibly due to antiepileptics or drug fever or central fever from Leigh syndrome -ID recommended stopping antibiotics and monitoring fever curve  Sinus tachycardia -Patient had sinus tachycardia during this hospitalization -Overnight had seizure spell with tachycardia, NSVT on monitor with heart rate in 130s. -Continue metoprolol, currently HR improving, sinus in 90s  Mitochondrial leigh syndrome Continue levocarnitine  PEG tube malfunction On admission 12/19, exchanged on 12/31  Angioedema Resolved, patient developed angioedema on 12/27 with upper lip, right earlobe edema after receiving IV Ativan  Stage II left trochanteric pressure ulcer stage I ulcers left lateral malleolus right foot Continue wound care   Code Status: Full CODE STATUS DVT Prophylaxis:  SCD's Family Communication: Discussed in detail with the patient, all imaging results, lab results explained to the patient's mother at bedside  Disposition Plan: When cleared by neurology  Time Spent in minutes   35 minutes  Procedures:  EEG Cortrack placement 12/23 PEG tube exchanged on 12/31  Consultants:   Neurology  Antimicrobials:      Medications  Scheduled Meds: . feeding supplement (JEVITY 1.2 CAL)  474 mL Per Tube QID  . free water   150 mL Per Tube Q8H  . lacosamide  200 mg Per Tube BID  . levETIRAcetam  1,500 mg Per Tube BID  . levOCARNitine  250 mg Per Tube Daily  . metoprolol tartrate  12.5 mg Per Tube BID  . nutrition supplement (JUVEN)  1 packet Per Tube BID BM   Continuous Infusions: . sodium chloride 10 mL/hr at 10/11/18 1135   PRN Meds:.sodium chloride, acetaminophen (TYLENOL) oral liquid 160 mg/5 mL, acetaminophen, diphenhydrAMINE, lidocaine, loperamide HCl   Antibiotics   Anti-infectives (From admission, onward)   Start     Dose/Rate Route Frequency Ordered Stop   10/07/18 1500  vancomycin (VANCOCIN) IVPB 750 mg/150 ml premix  Status:  Discontinued     750 mg 150 mL/hr over 60 Minutes Intravenous Every 12 hours 10/07/18 1426 10/08/18 1305   10/05/18 0400  vancomycin (VANCOCIN) IVPB 750 mg/150 ml premix  Status:  Discontinued     750 mg 150 mL/hr over 60 Minutes Intravenous Every 8 hours 10/04/18 1946 10/07/18 1426   10/04/18 2000  vancomycin (VANCOCIN) IVPB 1000 mg/200 mL premix     1,000 mg 200 mL/hr over 60 Minutes Intravenous  Once 10/04/18 1946 10/04/18 2200   10/04/18 2000  meropenem (MERREM) 1 g in sodium chloride 0.9 % 100 mL IVPB  Status:  Discontinued     1 g 200 mL/hr over 30 Minutes Intravenous Every 8 hours 10/04/18 1946 10/08/18 1305   09/29/18 1400  meropenem (MERREM) 1 g in sodium chloride 0.9 % 100 mL IVPB     1 g 200 mL/hr over 30 Minutes Intravenous Every 8 hours 09/29/18 1306 10/02/18 2130   09/26/18 1100  vancomycin (VANCOCIN) 500 mg in sodium chloride 0.9 % 100 mL IVPB  Status:  Discontinued     500 mg 100 mL/hr over 60 Minutes Intravenous Every 8 hours 09/26/18 1015 09/28/18 1224   09/25/18 1700  vancomycin (VANCOCIN) IVPB 1000 mg/200 mL premix     1,000 mg 200 mL/hr over 60 Minutes Intravenous  Once 09/25/18 1608 09/25/18 2000   09/25/18 1700  aztreonam (AZACTAM) 2 g in sodium chloride 0.9 % 100 mL IVPB  Status:  Discontinued     2 g 200 mL/hr over 30 Minutes Intravenous  Every 8 hours 09/25/18 1608 09/29/18 1301   09/25/18 1700  acyclovir (ZOVIRAX) 450 mg in dextrose 5 % 100 mL IVPB  Status:  Discontinued     450 mg 109 mL/hr over 60 Minutes Intravenous Every 8 hours 09/25/18 1608 09/29/18 1301        Subjective:   Jason Glover was seen and examined today.  Other at the bedside, overnight events noted, nonverbal.  Occasional jerking movements  Objective:   Vitals:   10/11/18 2300 10/12/18 0400 10/12/18 0700 10/12/18 1258  BP: 111/82 121/77 109/72 117/72  Pulse: (!) 106 (!) 110 (!) 118   Resp: 13 20 20 20   Temp: 98.7 F (37.1 C) 98.5 F (36.9 C) 99 F (37.2 C) 98.7 F (37.1 C)  TempSrc: Oral Oral Oral Oral  SpO2: 97% 100% 100%   Weight:      Height:  Intake/Output Summary (Last 24 hours) at 10/12/2018 1321 Last data filed at 10/12/2018 0725 Gross per 24 hour  Intake 0 ml  Output 1200 ml  Net -1200 ml     Wt Readings from Last 3 Encounters:  09/25/18 47.8 kg     Exam  General: Nonverbal, cerebral palsy, appears comfortable  Eyes:   HEENT:    Cardiovascular: S1 S2 auscultated, tachycardia, regular rate and rhythm.  Respiratory: Clear to auscultation bilaterally, no wheezing, rales or rhonchi  Gastrointestinal: Soft, nontender, nondistended, + bowel sounds  Ext: no pedal edema bilaterally  Neuro: Alert and tracks however nonverbal, does not follow commands  Musculoskeletal: No digital cyanosis, clubbing  Skin: No rashes, contractures  Psych: Nonverbal   Data Reviewed:  I have personally reviewed following labs and imaging studies  Micro Results Recent Results (from the past 240 hour(s))  Culture, Urine     Status: None   Collection Time: 10/04/18  6:59 PM  Result Value Ref Range Status   Specimen Description URINE, RANDOM  Final   Special Requests NONE  Final   Culture   Final    NO GROWTH Performed at [facility], [address] [place], [place] [address]    Report Status 10/07/2018 FINAL   Final  Culture, blood (Routine X 2) w Reflex to ID Panel     Status: None   Collection Time: 10/04/18  8:13 PM  Result Value Ref Range Status   Specimen Description BLOOD LEFT ANTECUBITAL  Final   Special Requests   Final    BOTTLES DRAWN AEROBIC AND ANAEROBIC Blood Culture adequate volume   Culture   Final    NO GROWTH 5 DAYS Performed at [facility], [address] [place], [place] [address]    Report Status 10/09/2018 FINAL  Final  Culture, blood (Routine X 2) w Reflex to ID Panel     Status: None   Collection Time: 10/04/18  8:18 PM  Result Value Ref Range Status   Specimen Description BLOOD LEFT WRIST  Final   Special Requests   Final    BOTTLES DRAWN AEROBIC AND ANAEROBIC Blood Culture adequate volume   Culture   Final    NO GROWTH 5 DAYS Performed at [facility], 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 Electronically Signed   By: Awilda Metroourtnay  Bloomer M.D.   On: 09/26/2018 23:29   Result Date: 09/26/2018 CLINICAL DATA:  Encephalopathy. History of Leigh syndrome/mitochondrial disorder. EXAM: MRI HEAD WITHOUT AND WITH CONTRAST TECHNIQUE: Multiplanar, multiecho pulse sequences of the brain and surrounding structures were obtained without and with intravenous contrast. CONTRAST:  4 cc Gadavist COMPARISON:  MRI head November 06, 2005 FINDINGS: Moderately motion degraded examination. INTRACRANIAL CONTENTS: Rounded reduced diffusion LEFT frontal cortex with T2 shine and central nonenhancing low ADC values with enhancement. Symmetric FLAIR T2 hyperintense signal and volume loss bilateral basal ganglia and hippocampi. Severe pontocerebellar atrophy, progressed from 2007. No midline shift, mass effect or masses. No hydrocephalus. No abnormal extra-axial fluid  collections or extra-axial enhancement. VASCULAR: Normal major intracranial vascular flow voids present at skull base. SKULL AND UPPER CERVICAL SPINE: No abnormal sellar expansion. No suspicious calvarial bone marrow signal. Craniocervical junction maintained. SINUSES/ORBITS: Mild LEFT maxillary sinus mucosal thickening. Mastoid air cells  are well aerated. Included ocular globes and orbital contents are non-suspicious. OTHER: None. IMPRESSION: 1. Moderately motion degraded examination. LEFT frontal cortical lesion with imaging characteristics of subacute Leigh lesion or other demyelination. 2. Symmetrically atrophic basal ganglia and hippocampi associated seen with Leigh syndrome though, other neurodegenerative syndromes can have this appearance. 3. Severe Pontocerebellar atrophy progressed from 2007. Electronically Signed: By: Awilda Metro M.D. On: 09/26/2018 23:19   Ir Replc Gastro/colonic Tube Percut W/fluoro  Result Date: 10/07/2018 INDICATION: Malfunctioning gastrostomy tube.  Plan for gastrostomy tube exchange EXAM: GASTROSTOMY TUBE EXCHANGE WITH FLUOROSCOPY MEDICATIONS: None ANESTHESIA/SEDATION: None CONTRAST:  5 mL-administered into the gastric lumen. FLUOROSCOPY TIME:  Fluoroscopy Time 12 seconds, 0.4 mGy. COMPLICATIONS: None immediate. PROCEDURE: Informed written consent was obtained from the patient's mother after a thorough discussion of the procedural risks, benefits and alternatives. All questions were addressed. A timeout was performed prior to the initiation of the procedure. The existing catheter was prepped and draped. Old catheter balloon was no longer inflated and the tube was easily removed with traction. A new 16 French, 3 cm MIC-KEY button gastrostomy tube was easily advanced in the stomach. Balloon was inflated with 5 mL of saline. Contrast injection confirmed placement in the stomach. IMPRESSION: Successful exchange of the MIC-KEY button gastrostomy tube. Electronically Signed    By: Richarda Overlie M.D.   On: 10/07/2018 17:20   Dg Chest Port 1 View  Result Date: 09/26/2018 CLINICAL DATA:  Fever EXAM: PORTABLE CHEST 1 VIEW COMPARISON:  03/26/2010 FINDINGS: Heart and mediastinal contours are within normal limits. No focal opacities or effusions. No acute bony abnormality. IMPRESSION: No active disease. Electronically Signed   By: Charlett Nose M.D.   On: 09/26/2018 07:32   Dg Chest Port 1v Same Day  Result Date: 10/04/2018 CLINICAL DATA:  Acute onset of fever. EXAM: PORTABLE CHEST 1 VIEW COMPARISON:  Chest radiograph performed 09/26/2018 FINDINGS: The patient's enteric tube is seen extending about the antrum of the stomach. There is elevation of the right hemidiaphragm. The lungs appear grossly clear. No focal consolidation, pleural effusion or pneumothorax is seen. The cardiomediastinal silhouette is normal in size. No acute osseous abnormalities are identified. Evaluation is mildly suboptimal due to patient rotation. IMPRESSION: No acute cardiopulmonary process seen; elevation of the right hemidiaphragm. Electronically Signed   By: Roanna Raider M.D.   On: 10/04/2018 21:57   Dg Abd Portable 1v  Result Date: 09/29/2018 CLINICAL DATA:  Feeding tube placement EXAM: PORTABLE ABDOMEN - 1 VIEW COMPARISON:  None. FINDINGS: The tip of the weighted enteric tube projects in the expected location of the gastroduodenal junction. No dilated small bowel. There is severe left convex scoliosis. Visualized lung bases are clear. IMPRESSION: Tip of the weighted enteric tube projects in the expected location of the gastroduodenal junction. Electronically Signed   By: Deatra Robinson M.D.   On: 09/29/2018 16:58    Lab Data:  CBC: Recent Labs  Lab 10/07/18 0700  WBC 14.2*  HGB 10.7*  HCT 32.6*  MCV 88.1  PLT 461*   Basic Metabolic Panel: Recent Labs  Lab 10/07/18 0700 10/10/18 0357 10/11/18 0437 10/11/18 2101  NA 138 137 139 137  K 4.1 4.2 4.2 3.5  CL 106 108 112* 112*  CO2 21*  20* 19* 16*  GLUCOSE 105* 108* 87 119*  BUN 23* 24* 19 25*  CREATININE 0.48* 0.48* 0.39* 0.46*  CALCIUM 9.0 8.6* 8.6* 8.5*   GFR: Estimated Creatinine Clearance: 92.9 mL/min (A) (by C-G formula based on  SCr of 0.46 mg/dL (L)). Liver Function Tests: Recent Labs  Lab 10/10/18 0357 10/11/18 0437 10/11/18 2101  AST 85* 84* 93*  ALT 188* 202* 204*  ALKPHOS 110 107 113  BILITOT 0.2* 0.3 0.4  PROT 5.9* 6.0* 6.0*  ALBUMIN 2.3* 2.4* 2.3*   No results for input(s): LIPASE, AMYLASE in the last 168 hours. No results for input(s): AMMONIA in the last 168 hours. Coagulation Profile: No results for input(s): INR, PROTIME in the last 168 hours. Cardiac Enzymes: Recent Labs  Lab 10/11/18 2101  TROPONINI <0.03   BNP (last 3 results) No results for input(s): PROBNP in the last 8760 hours. HbA1C: No results for input(s): HGBA1C in the last 72 hours. CBG: Recent Labs  Lab 10/11/18 1949 10/11/18 2308 10/12/18 0434 10/12/18 0849 10/12/18 1152  GLUCAP 108* 82 89 99 106*   Lipid Profile: No results for input(s): CHOL, HDL, LDLCALC, TRIG, CHOLHDL, LDLDIRECT in the last 72 hours. Thyroid Function Tests: No results for input(s): TSH, T4TOTAL, FREET4, T3FREE, THYROIDAB in the last 72 hours. Anemia Panel: No results for input(s): VITAMINB12, FOLATE, FERRITIN, TIBC, IRON, RETICCTPCT in the last 72 hours. Urine analysis:    Component Value Date/Time   COLORURINE YELLOW 10/06/2018 1025   APPEARANCEUR CLEAR 10/06/2018 1025   LABSPEC 1.015 10/06/2018 1025   PHURINE 8.0 10/06/2018 1025   GLUCOSEU NEGATIVE 10/06/2018 1025   HGBUR NEGATIVE 10/06/2018 1025   BILIRUBINUR NEGATIVE 10/06/2018 1025   KETONESUR NEGATIVE 10/06/2018 1025   PROTEINUR NEGATIVE 10/06/2018 1025   NITRITE NEGATIVE 10/06/2018 1025   LEUKOCYTESUR NEGATIVE 10/06/2018 1025       M.D. [facility] 10/12/2018, 1:21 PM  Pager: [phone] Between 7am to 7pm - call Pager - [phone]  After 7pm go to  [email or web address] - password [email or web address]  Call night coverage person covering after 7pm

## 2018-10-13 DIAGNOSIS — Z4659 Encounter for fitting and adjustment of other gastrointestinal appliance and device: Secondary | ICD-10-CM

## 2018-10-13 DIAGNOSIS — K5901 Slow transit constipation: Secondary | ICD-10-CM

## 2018-10-13 LAB — CBC
HEMATOCRIT: 32 % — AB (ref 39.0–52.0)
HEMOGLOBIN: 10.3 g/dL — AB (ref 13.0–17.0)
MCH: 28.4 pg (ref 26.0–34.0)
MCHC: 32.2 g/dL (ref 30.0–36.0)
MCV: 88.2 fL (ref 80.0–100.0)
Platelets: 377 10*3/uL (ref 150–400)
RBC: 3.63 MIL/uL — ABNORMAL LOW (ref 4.22–5.81)
RDW: 16.3 % — ABNORMAL HIGH (ref 11.5–15.5)
WBC: 9.3 10*3/uL (ref 4.0–10.5)
nRBC: 0 % (ref 0.0–0.2)

## 2018-10-13 LAB — COMPREHENSIVE METABOLIC PANEL
ALT: 176 U/L — ABNORMAL HIGH (ref 0–44)
AST: 67 U/L — ABNORMAL HIGH (ref 15–41)
Albumin: 2.4 g/dL — ABNORMAL LOW (ref 3.5–5.0)
Alkaline Phosphatase: 118 U/L (ref 38–126)
Anion gap: 10 (ref 5–15)
BUN: 17 mg/dL (ref 6–20)
CHLORIDE: 109 mmol/L (ref 98–111)
CO2: 17 mmol/L — AB (ref 22–32)
Calcium: 8.7 mg/dL — ABNORMAL LOW (ref 8.9–10.3)
Creatinine, Ser: 0.44 mg/dL — ABNORMAL LOW (ref 0.61–1.24)
GFR calc Af Amer: >60 mL/min (ref >60)
GFR calc non Af Amer: >60 mL/min (ref >60)
Glucose, Bld: 119 mg/dL — ABNORMAL HIGH (ref 70–99)
Potassium: 3.6 mmol/L (ref 3.5–5.1)
Sodium: 136 mmol/L (ref 135–145)
Total Bilirubin: 0.4 mg/dL (ref 0.3–1.2)
Total Protein: 6 g/dL — ABNORMAL LOW (ref 6.5–8.1)

## 2018-10-13 LAB — HEPATIC FUNCTION PANEL
ALT: 141 U/L — AB (ref 0–44)
AST: 42 U/L — ABNORMAL HIGH (ref 15–41)
Albumin: 2.4 g/dL — ABNORMAL LOW (ref 3.5–5.0)
Alkaline Phosphatase: 111 U/L (ref 38–126)
Bilirubin, Direct: 0.2 mg/dL (ref 0.0–0.2)
Indirect Bilirubin: 0.2 mg/dL — ABNORMAL LOW (ref 0.3–0.9)
Total Bilirubin: 0.4 mg/dL (ref 0.3–1.2)
Total Protein: 6.2 g/dL — ABNORMAL LOW (ref 6.5–8.1)

## 2018-10-13 LAB — GLUCOSE, CAPILLARY
GLUCOSE-CAPILLARY: 111 mg/dL — AB (ref 70–99)
Glucose-Capillary: 99 mg/dL (ref 70–99)
Glucose-Capillary: 86 mg/dL (ref 70–99)
Glucose-Capillary: 98 mg/dL (ref 70–99)
Glucose-Capillary: 94 mg/dL (ref 70–99)

## 2018-10-13 MED ORDER — JEVITY 1.2 CAL PO LIQD
1000.0000 mL | ORAL | Status: DC
  Administered 2018-10-13 – 2018-10-17 (×3): 1000 mL
  Filled 2018-10-13 (×10): qty 1000

## 2018-10-13 MED ORDER — OXYCODONE-ACETAMINOPHEN 5-325 MG PO TABS
1.0000 | ORAL_TABLET | Freq: Four times a day (QID) | ORAL | Status: DC | PRN
  Administered 2018-10-13 – 2018-10-17 (×7): 1 via ORAL
  Filled 2018-10-13 (×7): qty 1

## 2018-10-13 NOTE — Procedures (Signed)
LTM-EEG Report  HISTORY: Continuous video-EEG monitoring performed for 28 year old with Leigh syndrome, epilepsy, admitted with breakthrough seizures.  ACQUISITION: International 10-20 system for electrode placement; 18 channels with additional eyes linked to ipsilateral ears and EKG. Additional T1-T2 electrodes were used. Continuous video recording obtained.   EEG NUMBER:  MEDICATIONS:  Day 1: see EMR  DAY #1: from 1520 10/12/18 to 0700 10/13/18 BACKGROUND: An overall medium voltage continuous recording with some spontaneous variability and reactivity. Waking background consisted of medium voltage 2-6Hz  activity bilaterally and fragments of a 7hz  posterior basic rhythm. State changes were observed with atypical arousal patterns (increased delta range activity with waking). No clear sleep architecture was observed.  EPILEPTIFORM/PERIODIC ACTIVITY: There were occasional runs of generalized rhythmic delta activity (GRDA) primarily in waking, short in duration, without clinical signs or ictal evolution. The runs of GRDA were less prominent overnight. SEIZURES: none EVENTS: none reported  EKG: no significant arrhythmia  SUMMARY: This was a moderately abnormal continuous video due to generalized background slowing with some runs of GRDA. This was indicative of a non-specific encephalopathy pattern. No definite epileptiform discharges were seen. The runs of GRDA improved overnight with increased resting/sleep observed.

## 2018-10-13 NOTE — Progress Notes (Signed)
LTM discontinued; Dr Dorthea Cove notified. No skin breakdown was seen. Mom insisted on washing her son's head.

## 2018-10-13 NOTE — Plan of Care (Signed)
  Problem: Open Wound Care Goal: Ability to return to baseline activity level will improve Outcome: Progressing   Problem: Pain Management Goal: Ability to return to baseline activity level will improve Description Manage pain with FACES scale  Outcome: Progressing   Problem: Education: Goal: Knowledge of General Education information will improve Description Including pain rating scale, medication(s)/side effects and non-pharmacologic comfort measures Outcome: Progressing   Problem: Spiritual Needs Goal: Ability to function at adequate level Outcome: Progressing   Problem: Health Behavior/Discharge Planning: Goal: Ability to manage health-related needs will improve Outcome: Progressing   Problem: Clinical Measurements: Goal: Ability to maintain clinical measurements within normal limits will improve Outcome: Progressing Goal: Will remain free from infection Outcome: Progressing Goal: Diagnostic test results will improve Outcome: Progressing Goal: Respiratory complications will improve Outcome: Progressing Goal: Cardiovascular complication will be avoided Outcome: Progressing   Problem: Activity: Goal: Risk for activity intolerance will decrease Outcome: Progressing   Problem: Nutrition: Goal: Adequate nutrition will be maintained Outcome: Progressing   Problem: Coping: Goal: Level of anxiety will decrease Outcome: Progressing   Problem: Elimination: Goal: Will not experience complications related to bowel motility Outcome: Progressing Goal: Will not experience complications related to urinary retention Outcome: Progressing   Problem: Pain Managment: Goal: General experience of comfort will improve Outcome: Progressing   Problem: Safety: Goal: Ability to remain free from injury will improve Outcome: Progressing   Problem: Skin Integrity: Goal: Risk for impaired skin integrity will decrease Outcome: Progressing

## 2018-10-13 NOTE — Progress Notes (Signed)
[facility]                                                                              Patient Demographics  Jason Glover, is a 28 y.o. male, DOB - [birth year], ZOX:[id number]  Admit date - 09/25/2018   Admitting Physician Therisa DoyneAnastassia Doutova, MD  Outpatient Primary MD for the patient is [name], [name], MD  Outpatient specialists:   LOS - 18  days   Medical records reviewed and are as summarized below:    No chief complaint on file.      Brief summary   Patient is a 28 year old man PMH mitochondrial Leigh syndrome, pressure ulcers, feeding tubes placement, transferred from [facility] after presenting with acute encephalopathy, fever and uncontrollable movements right upper extremity. At baseline able to communicate uses a motorized wheelchair. Admitted for further evaluation and treatment of focal status epilepticus and fever. Seen by neurology, stat EEG showed no epileptiform discharges and patient loaded with Vimpat. Patient followed by neurology, long-term EEG was negative but cannot rule out focal status epilepticus. Despite being no longer febrile, being treated with antibiotics and having extensive work-up as well as consultation with infectious disease in 3 antiepileptics as per neurology there is been no significant change in his condition.  Assessment & Plan   Principal problem Focal status epilepticus, seizures, acute metabolic encephalopathy with underlying CP. -Initial EG and prolonged EEG showed no evidence of seizures, patient was started on thiamine per neurology -MRI brain showed restricted diffusion left frontal cortex which could represent ongoing seizures. -Overnight on 1/5, had seizure-like spells with associated arrhythmia, seen by neurology, felt Vimpat dose was not being administered correctly. -Neurology recommended continue Keppra, Vimpat and thiamine -Due to transaminitis, Dilantin held, LFTs are  improving -EEG today  Transaminitis -To need to hold Dilantin, LFTs are improving  - doubt secondary to tube feeds as patient has been on long-term artificial feeding.  Severe protein calorie malnutrition -Per mother at the bedside, she wants tube feeds changed to continuous rather than bolus. -Dietitian consult placed  FUO -On admission, treated empirically with acyclovir, aztreonam and vancomycin. -Mother had declined LP, chest x-ray, UA negative, influenza negative, subsequently antibiotics were discontinued.  ID was consulted. -Blood cultures remain negative, felt possibly due to antiepileptics or drug fever or central fever from Leigh syndrome -ID recommended stopping antibiotics and monitoring fever curve  Sinus tachycardia -Patient had sinus tachycardia during this hospitalization -Heart rate better, continue metoprolol  -EKG changes, troponin was less than 0.03, no complaints of any chest pain. -No prior echo in the system, will check echo if regional wall motion abnormalities or depressed EF, will discuss with cardiology  Mitochondrial leigh syndrome Continue levocarnitine  PEG tube malfunction On admission 12/19, exchanged on 12/31  Angioedema Resolved, patient developed angioedema on 12/27 with upper lip, right earlobe edema after receiving IV Ativan  Stage II left trochanteric pressure ulcer stage I ulcers left lateral malleolus right foot Continue wound care   Code Status: Full CODE STATUS DVT Prophylaxis:  SCD's Family Communication: Discussed in detail with the patient, all imaging results, lab results explained to the patient's mother at bedside  Disposition Plan: When cleared by neurology  Time Spent in minutes   35 minutes  Procedures:  EEG Cortrack placement 12/23 PEG tube exchanged on 12/31  Consultants:   Neurology  Antimicrobials:      Medications  Scheduled Meds: . feeding supplement (JEVITY 1.2 CAL)  474 mL Per Tube QID  . free  water  150 mL Per Tube Q8H  . lacosamide  200 mg Per Tube BID  . levETIRAcetam  1,500 mg Per Tube BID  . levOCARNitine  250 mg Per Tube Daily  . metoprolol tartrate  12.5 mg Per Tube BID  . nutrition supplement (JUVEN)  1 packet Per Tube BID BM   Continuous Infusions: . sodium chloride 10 mL/hr at 10/11/18 1135   PRN Meds:.sodium chloride, acetaminophen (TYLENOL) oral liquid 160 mg/5 mL, acetaminophen, diphenhydrAMINE, lidocaine, loperamide HCl   Antibiotics   Anti-infectives (From admission, onward)   Start     Dose/Rate Route Frequency Ordered Stop   10/07/18 1500  vancomycin (VANCOCIN) IVPB 750 mg/150 ml premix  Status:  Discontinued     750 mg 150 mL/hr over 60 Minutes Intravenous Every 12 hours 10/07/18 1426 10/08/18 1305   10/05/18 0400  vancomycin (VANCOCIN) IVPB 750 mg/150 ml premix  Status:  Discontinued     750 mg 150 mL/hr over 60 Minutes Intravenous Every 8 hours 10/04/18 1946 10/07/18 1426   10/04/18 2000  vancomycin (VANCOCIN) IVPB 1000 mg/200 mL premix     1,000 mg 200 mL/hr over 60 Minutes Intravenous  Once 10/04/18 1946 10/04/18 2200   10/04/18 2000  meropenem (MERREM) 1 g in sodium chloride 0.9 % 100 mL IVPB  Status:  Discontinued     1 g 200 mL/hr over 30 Minutes Intravenous Every 8 hours 10/04/18 1946 10/08/18 1305   09/29/18 1400  meropenem (MERREM) 1 g in sodium chloride 0.9 % 100 mL IVPB     1 g 200 mL/hr over 30 Minutes Intravenous Every 8 hours 09/29/18 1306 10/02/18 2130   09/26/18 1100  vancomycin (VANCOCIN) 500 mg in sodium chloride 0.9 % 100 mL IVPB  Status:  Discontinued     500 mg 100 mL/hr over 60 Minutes Intravenous Every 8 hours 09/26/18 1015 09/28/18 1224   09/25/18 1700  vancomycin (VANCOCIN) IVPB 1000 mg/200 mL premix     1,000 mg 200 mL/hr over 60 Minutes Intravenous  Once 09/25/18 1608 09/25/18 2000   09/25/18 1700  aztreonam (AZACTAM) 2 g in sodium chloride 0.9 % 100 mL IVPB  Status:  Discontinued     2 g 200 mL/hr over 30 Minutes  Intravenous Every 8 hours 09/25/18 1608 09/29/18 1301   09/25/18 1700  acyclovir (ZOVIRAX) 450 mg in dextrose 5 % 100 mL IVPB  Status:  Discontinued     450 mg 109 mL/hr over 60 Minutes Intravenous Every 8 hours 09/25/18 1608 09/29/18 1301        Subjective:   Jason Glover was seen and examined today.  Alert and awake, mother at the bedside, no acute issues overnight.  Mother has concerns about tube feeds, getting too much bolus feedings.  Overnight no seizure activity.  Objective:   Vitals:   10/12/18 2300 10/13/18 0300 10/13/18 0800 10/13/18 1130  BP: 107/73 115/77 105/69 97/63  Pulse: 94 (!) 101 (!) 108 (!) 106  Resp: 18 17 17 13   Temp: 99 F (37.2 C) 99.3 F (37.4 C) 98 F (36.7 C) 98.4 F (36.9 C)  TempSrc: Oral Axillary Axillary Axillary  SpO2: 100%  98% 99% 99%  Weight:      Height:        Intake/Output Summary (Last 24 hours) at 10/13/2018 1323 Last data filed at 10/13/2018 0300 Gross per 24 hour  Intake 0 ml  Output 1275 ml  Net -1275 ml     Wt Readings from Last 3 Encounters:  09/25/18 47.8 kg    Physical Exam  General: Nonverbal, alert, tracking with eyes.  Cerebral palsy, close to baseline per mother   Eyes:   HEENT:   Cardiovascular: S1 S2 clear, RRR. No pedal edema b/l  Respiratory: CTAB, no wheezing, rales or rhonchi  Gastrointestinal: Soft, nontender, nondistended, NBS  Ext: no pedal edema bilaterally  Neuro: Does not follow commands  Musculoskeletal: Contractures  Skin: No rashes  Psych: Nonverbal     Data Reviewed:  I have personally reviewed following labs and imaging studies  Micro Results Recent Results (from the past 240 hour(s))  Culture, Urine     Status: None   Collection Time: 10/04/18  6:59 PM  Result Value Ref Range Status   Specimen Description URINE, RANDOM  Final   Special Requests NONE  Final   Culture   Final    NO GROWTH Performed at [facility], [address] [place], [place] [address]     Report Status 10/07/2018 FINAL  Final  Culture, blood (Routine X 2) w Reflex to ID Panel     Status: None   Collection Time: 10/04/18  8:13 PM  Result Value Ref Range Status   Specimen Description BLOOD LEFT ANTECUBITAL  Final   Special Requests   Final    BOTTLES DRAWN AEROBIC AND ANAEROBIC Blood Culture adequate volume   Culture   Final    NO GROWTH 5 DAYS Performed at [facility], [address], [place], [place] [address]    Report Status 10/09/2018 FINAL  Final  Culture, blood (Routine X 2) w Reflex to ID Panel     Status: None   Collection Time: 10/04/18  8:18 PM  Result Value Ref Range Status   Specimen Description BLOOD LEFT WRIST  Final   Special Requests   Final    BOTTLES DRAWN AEROBIC AND ANAEROBIC Blood Culture adequate volume   Culture   Final    NO GROWTH 5 DAYS Performed at [facility], 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 Electronically Signed   By: Awilda Metro M.D.   On: 09/26/2018 23:29   Result Date: 09/26/2018 CLINICAL DATA:  Encephalopathy. History of Leigh syndrome/mitochondrial disorder. EXAM: MRI HEAD WITHOUT AND WITH CONTRAST TECHNIQUE: Multiplanar, multiecho pulse sequences of the brain and surrounding structures were obtained without and with intravenous contrast. CONTRAST:  4 cc Gadavist COMPARISON:  MRI head November 06, 2005 FINDINGS: Moderately motion degraded examination. INTRACRANIAL CONTENTS: Rounded reduced diffusion LEFT frontal cortex with T2 shine and central nonenhancing low ADC values with enhancement. Symmetric FLAIR T2 hyperintense signal and volume loss bilateral basal ganglia and hippocampi. Severe pontocerebellar atrophy, progressed from 2007. No midline shift, mass effect or masses. No hydrocephalus. No  abnormal extra-axial fluid collections or extra-axial enhancement. VASCULAR: Normal major intracranial vascular flow voids present at skull base. SKULL AND UPPER CERVICAL SPINE: No abnormal sellar expansion. No suspicious calvarial bone marrow  signal. Craniocervical junction maintained. SINUSES/ORBITS: Mild LEFT maxillary sinus mucosal thickening. Mastoid air cells are well aerated. Included ocular globes and orbital contents are non-suspicious. OTHER: None. IMPRESSION: 1. Moderately motion degraded examination. LEFT frontal cortical lesion with imaging characteristics of subacute Leigh lesion or other demyelination. 2. Symmetrically atrophic basal ganglia and hippocampi associated seen with Leigh syndrome though, other neurodegenerative syndromes can have this appearance. 3. Severe Pontocerebellar atrophy progressed from 2007. Electronically Signed: By: Awilda Metro M.D. On: 09/26/2018 23:19   Ir Replc Gastro/colonic Tube Percut W/fluoro  Result Date: 10/07/2018 INDICATION: Malfunctioning gastrostomy tube.  Plan for gastrostomy tube exchange EXAM: GASTROSTOMY TUBE EXCHANGE WITH FLUOROSCOPY MEDICATIONS: None ANESTHESIA/SEDATION: None CONTRAST:  5 mL-administered into the gastric lumen. FLUOROSCOPY TIME:  Fluoroscopy Time 12 seconds, 0.4 mGy. COMPLICATIONS: None immediate. PROCEDURE: Informed written consent was obtained from the patient's mother after a thorough discussion of the procedural risks, benefits and alternatives. All questions were addressed. A timeout was performed prior to the initiation of the procedure. The existing catheter was prepped and draped. Old catheter balloon was no longer inflated and the tube was easily removed with traction. A new 16 French, 3 cm MIC-KEY button gastrostomy tube was easily advanced in the stomach. Balloon was inflated with 5 mL of saline. Contrast injection confirmed placement in the stomach. IMPRESSION: Successful exchange of the MIC-KEY button gastrostomy tube.  Electronically Signed   By: Richarda Overlie M.D.   On: 10/07/2018 17:20   Dg Chest Port 1 View  Result Date: 09/26/2018 CLINICAL DATA:  Fever EXAM: PORTABLE CHEST 1 VIEW COMPARISON:  03/26/2010 FINDINGS: Heart and mediastinal contours are within normal limits. No focal opacities or effusions. No acute bony abnormality. IMPRESSION: No active disease. Electronically Signed   By: Charlett Nose M.D.   On: 09/26/2018 07:32   Dg Chest Port 1v Same Day  Result Date: 10/04/2018 CLINICAL DATA:  Acute onset of fever. EXAM: PORTABLE CHEST 1 VIEW COMPARISON:  Chest radiograph performed 09/26/2018 FINDINGS: The patient's enteric tube is seen extending about the antrum of the stomach. There is elevation of the right hemidiaphragm. The lungs appear grossly clear. No focal consolidation, pleural effusion or pneumothorax is seen. The cardiomediastinal silhouette is normal in size. No acute osseous abnormalities are identified. Evaluation is mildly suboptimal due to patient rotation. IMPRESSION: No acute cardiopulmonary process seen; elevation of the right hemidiaphragm. Electronically Signed   By: Roanna Raider M.D.   On: 10/04/2018 21:57   Dg Abd Portable 1v  Result Date: 09/29/2018 CLINICAL DATA:  Feeding tube placement EXAM: PORTABLE ABDOMEN - 1 VIEW COMPARISON:  None. FINDINGS: The tip of the weighted enteric tube projects in the expected location of the gastroduodenal junction. No dilated small bowel. There is severe left convex scoliosis. Visualized lung bases are clear. IMPRESSION: Tip of the weighted enteric tube projects in the expected location of the gastroduodenal junction. Electronically Signed   By: Deatra Robinson M.D.   On: 09/29/2018 16:58    Lab Data:  CBC: Recent Labs  Lab 10/07/18 0700 10/13/18 0559  WBC 14.2* 9.3  HGB 10.7* 10.3*  HCT 32.6* 32.0*  MCV 88.1 88.2  PLT 461* 377   Basic Metabolic Panel: Recent Labs  Lab 10/07/18 0700 10/10/18 0357 10/11/18 0437 10/11/18 2101  10/13/18 0559  NA 138 137 139 137 136  K 4.1 4.2 4.2 3.5 3.6  CL 106 108 112* 112* 109  CO2 21* 20* 19* 16* 17*  GLUCOSE 105* 108* 87 119* 119*  BUN 23* 24*  19 25* 17  CREATININE 0.48* 0.48* 0.39* 0.46* 0.44*  CALCIUM 9.0 8.6* 8.6* 8.5* 8.7*   GFR: Estimated Creatinine Clearance: 92.9 mL/min (A) (by C-G formula based on SCr of 0.44 mg/dL (L)). Liver Function Tests: Recent Labs  Lab 10/10/18 0357 10/11/18 0437 10/11/18 2101 10/13/18 0559  AST 85* 84* 93* 67*  ALT 188* 202* 204* 176*  ALKPHOS 110 107 113 118  BILITOT 0.2* 0.3 0.4 0.4  PROT 5.9* 6.0* 6.0* 6.0*  ALBUMIN 2.3* 2.4* 2.3* 2.4*   No results for input(s): LIPASE, AMYLASE in the last 168 hours. No results for input(s): AMMONIA in the last 168 hours. Coagulation Profile: No results for input(s): INR, PROTIME in the last 168 hours. Cardiac Enzymes: Recent Labs  Lab 10/11/18 2101  TROPONINI <0.03   BNP (last 3 results) No results for input(s): PROBNP in the last 8760 hours. HbA1C: No results for input(s): HGBA1C in the last 72 hours. CBG: Recent Labs  Lab 10/12/18 2015 10/12/18 2324 10/13/18 0454 10/13/18 0807 10/13/18 1203  GLUCAP 90 92 99 86 98   Lipid Profile: No results for input(s): CHOL, HDL, LDLCALC, TRIG, CHOLHDL, LDLDIRECT in the last 72 hours. Thyroid Function Tests: No results for input(s): TSH, T4TOTAL, FREET4, T3FREE, THYROIDAB in the last 72 hours. Anemia Panel: No results for input(s): VITAMINB12, FOLATE, FERRITIN, TIBC, IRON, RETICCTPCT in the last 72 hours. Urine analysis:    Component Value Date/Time   COLORURINE YELLOW 10/06/2018 1025   APPEARANCEUR CLEAR 10/06/2018 1025   LABSPEC 1.015 10/06/2018 1025   PHURINE 8.0 10/06/2018 1025   GLUCOSEU NEGATIVE 10/06/2018 1025   HGBUR NEGATIVE 10/06/2018 1025   BILIRUBINUR NEGATIVE 10/06/2018 1025   KETONESUR NEGATIVE 10/06/2018 1025   PROTEINUR NEGATIVE 10/06/2018 1025   NITRITE NEGATIVE 10/06/2018 1025   LEUKOCYTESUR NEGATIVE  10/06/2018 1025     Ripudeep Rai M.D. [facility] 10/13/2018, 1:23 PM  Pager: [phone] Between 7am to 7pm - call Pager - [phone]  After 7pm go to [email or web address] - password [email or web address]  Call night coverage person covering after 7pm

## 2018-10-13 NOTE — Progress Notes (Signed)
Nutrition Follow-up  DOCUMENTATION CODES:   Severe malnutrition in context of acute illness/injury  INTERVENTION:  Initiate continuous tube feeds using Jevity 1.2 formula via PEG at rate of 40 ml/hr and increase by 10 ml every 4 hours to goal rate of 80 ml/hr.   Continue free water flushes of 150 ml q 8 hours per tube.   Tube feeding regimen provides 2304 kcal (100% of needs), 107 grams of protein, and 2005 ml of H2O.   Continue 1 packet Juven BID, each packet provides 80 calories, 8 grams of carbohydrate, and 14 grams of amino acids; supplement contains CaHMB, glutamine, and arginine, to promote wound healing.  NUTRITION DIAGNOSIS:   Severe Malnutrition related to acute illness as evidenced by moderate fat depletion, severe muscle depletion, energy intake < or equal to 50% for > or equal to 5 days, percent weight loss; ongoing  GOAL:   Patient will meet greater than or equal to 90% of their needs; met with TF  MONITOR:   Diet advancement, Weight trends, PO intake, Skin, TF tolerance  REASON FOR ASSESSMENT:   Consult Enteral/tube feeding initiation and management  ASSESSMENT:   28 yo male, admitted with seizures. PMH significant for mitochondreal Leigh syndrome, pressure ulcers. Has PEG tube. Home meds list includes vitamin B-complex, ferrous sulfate, L-carnitine, Miralax. PEG exchanged 12/31.   Pt asleep and did not awaken to RD assessment. Mother at bedside. Tube feeds have been transitioned to bolus feeds of 474 ml TID and pt has been tolerating them well. Mom reports concern regarding the volume of bolus and requests to switch back to continuous tube feeds. RD to modify tube feeding orders. Labs and medications reviewed.   Diet Order:   Diet Order    None      EDUCATION NEEDS:   Not appropriate for education at this time  Skin:  Skin Assessment: Skin Integrity Issues: Skin Integrity Issues:: Stage I, Stage II Stage I: R foot Stage II: L buttocks Unstageable:  N/A  Last BM:  1/4  Height:   Ht Readings from Last 1 Encounters:  09/29/18 '5\' 9"'  (1.753 m)    Weight:   Wt Readings from Last 1 Encounters:  09/25/18 47.8 kg    Ideal Body Weight:  72.7 kg  BMI:  Body mass index is 15.56 kg/m.  Estimated Nutritional Needs:   Kcal:  7159-5396 calories daily (30-35 kcal/kg IBW)  Protein:  87-109 gm daily (1.2-1.5 g/kg IBW)  Fluid:  >/= 2.1 L daily or per MD discretion    Corrin Parker, MS, RD, LDN Pager # [phone] After hours/ weekend pager # [phone]

## 2018-10-13 NOTE — Progress Notes (Signed)
Reason for consult: Seizure, leigh syndrome   Subjective: Patient is apparently talking intermittently according to his mother.  Still not at his baseline.   ROS:  Unable to obtain due to poor mental status  Examination  Vital signs in last 24 hours: Temp:  [98 F (36.7 C)-100.2 F (37.9 C)] 98.4 F (36.9 C) (01/06 1130) Pulse Rate:  [94-120] 106 (01/06 1130) Resp:  [13-19] 13 (01/06 1130) BP: (97-125)/(63-77) 97/63 (01/06 1130) SpO2:  [98 %-100 %] 99 % (01/06 1130)  General: lying in bed CVS: pulse-normal rate and rhythm RS: breathing comfortably Extremities: normal   Neuro: MS: Awake, follows simple commands, nonverbal on assessment CN: pupils equal and reactive,  EOMI, face symmetric, t Motor: Moves all 4 extremities, increased tone, no jerking noted Coordination: normal Gait: not tested  Basic Metabolic Panel: Recent Labs  Lab 10/07/18 0700 10/10/18 0357 10/11/18 0437 10/11/18 2101 10/13/18 0559  NA 138 137 139 137 136  K 4.1 4.2 4.2 3.5 3.6  CL 106 108 112* 112* 109  CO2 21* 20* 19* 16* 17*  GLUCOSE 105* 108* 87 119* 119*  BUN 23* 24* 19 25* 17  CREATININE 0.48* 0.48* 0.39* 0.46* 0.44*  CALCIUM 9.0 8.6* 8.6* 8.5* 8.7*    CBC: Recent Labs  Lab 10/07/18 0700 10/13/18 0559  WBC 14.2* 9.3  HGB 10.7* 10.3*  HCT 32.6* 32.0*  MCV 88.1 88.2  PLT 461* 377     Coagulation Studies: No results for input(s): LABPROT, INR in the last 72 hours.  Imaging Reviewed:     ASSESSMENT AND PLAN  28 year old male with leg syndrome admitted for sepsis and misplaced PEG tube.  Was noted to have right upper extremity and right facial twitching concerning for focal status epilepticus.  Resolved with 3 antiepileptics with LTM EEG showing no seizure-like activity.  Reconsulted for elevated LFTs, Dilantin was discontinued.  Currently on Keppra and Vimpat and has had no further seizures.  Long-term EEG monitoring 24 hours was negative for any epileptiform discharges.   Patient had some background slowing with runs of generalized rhythmic delta activity.  Plan Discontinue long-term EEG Continue Vimpat and Keppra Continue thiamine replacement and  tube feeds F/u LFT and Ammonia     [facility] Pager Number [phone] For questions after 7pm please refer to AMION to reach the Neurologist on call

## 2018-10-14 ENCOUNTER — Inpatient Hospital Stay (HOSPITAL_COMMUNITY): Payer: Medicaid Other

## 2018-10-14 DIAGNOSIS — R9431 Abnormal electrocardiogram [ECG] [EKG]: Secondary | ICD-10-CM

## 2018-10-14 DIAGNOSIS — K5900 Constipation, unspecified: Secondary | ICD-10-CM

## 2018-10-14 LAB — BASIC METABOLIC PANEL
Anion gap: 9 (ref 5–15)
BUN: 18 mg/dL (ref 6–20)
CHLORIDE: 109 mmol/L (ref 98–111)
CO2: 19 mmol/L — ABNORMAL LOW (ref 22–32)
Calcium: 9 mg/dL (ref 8.9–10.3)
Creatinine, Ser: 0.52 mg/dL — ABNORMAL LOW (ref 0.61–1.24)
GFR calc Af Amer: >60 mL/min (ref >60)
GFR calc non Af Amer: >60 mL/min (ref >60)
Glucose, Bld: 116 mg/dL — ABNORMAL HIGH (ref 70–99)
Potassium: 4 mmol/L (ref 3.5–5.1)
Sodium: 137 mmol/L (ref 135–145)

## 2018-10-14 LAB — ECHOCARDIOGRAM COMPLETE
Height: 69 in
Weight: 1791.9 oz

## 2018-10-14 LAB — CBC
HCT: 33.6 % — ABNORMAL LOW (ref 39.0–52.0)
Hemoglobin: 10.7 g/dL — ABNORMAL LOW (ref 13.0–17.0)
MCH: 28.3 pg (ref 26.0–34.0)
MCHC: 31.8 g/dL (ref 30.0–36.0)
MCV: 88.9 fL (ref 80.0–100.0)
Platelets: 397 10*3/uL (ref 150–400)
RBC: 3.78 MIL/uL — ABNORMAL LOW (ref 4.22–5.81)
RDW: 16.5 % — ABNORMAL HIGH (ref 11.5–15.5)
WBC: 7.4 10*3/uL (ref 4.0–10.5)
nRBC: 0 % (ref 0.0–0.2)

## 2018-10-14 LAB — AMMONIA: Ammonia: 29 umol/L (ref 9–35)

## 2018-10-14 LAB — GLUCOSE, CAPILLARY
Glucose-Capillary: 120 mg/dL — ABNORMAL HIGH (ref 70–99)
Glucose-Capillary: 122 mg/dL — ABNORMAL HIGH (ref 70–99)
Glucose-Capillary: 150 mg/dL — ABNORMAL HIGH (ref 70–99)
Glucose-Capillary: 98 mg/dL (ref 70–99)

## 2018-10-14 NOTE — Progress Notes (Signed)
Echo at bedside

## 2018-10-14 NOTE — Progress Notes (Signed)
  Echocardiogram 2D Echocardiogram has been performed.  Jason Glover 10/14/2018, 4:23 PM

## 2018-10-14 NOTE — Progress Notes (Signed)
Note/order per Physician to look at stage 2 pressure ulcer per mom stating "it is getting worse".   Nurse already bathed patient this morning and assessed, cleansed and reapplied a new hydrocolloid dressing, wound has scant amount of sanguineus bleeding.   Mom refuses to allow staff to turn q2 and to perform hygiene as needed stating "I can do it".   Nurse will talk to Chiropodist about speaking with mom about allowing staff to properly do their job without her help/interference.   Nurse spoke with Case Management; mom to choose [facility] for feedings and also will pick a home health agency in area code of residence.

## 2018-10-14 NOTE — Progress Notes (Addendum)
Nutrition Follow-up  DOCUMENTATION CODES:   Severe malnutrition in context of acute illness/injury  INTERVENTION:   While inpatient:  Continue tube feeds using Jevity 1.2 formula via PEG at goal rate of 80 ml/hr.   Continue free water flushes of 150 ml q 8 hours per tube.  Tube feeding regimen provides 2304 kcal (100% of needs), 107 grams of protein, and 2005 ml of H2O.    Continue Juven BID, each packet provides 80 calories, 8 grams of carbohydrate, and 14 grams of amino acids; supplement contains CaHMB, glutamine, and arginine, to promote wound healing.  Upon discharge home,   Recommend nocturnal tube feedings using new formula of Jevity 1.5 formula via PEG at goal rate of 90 ml x 16 hours (5pm-9am).  Recommend 30 ml Prostat (or equivalent) once daily per tube.  Recommend free water flushes of 200 ml given 5 times daily per tube. Nocturnal tube feeds to provide 2260 kcal (100% of needs), 107 grams of protein, and 2094 ml water.   NUTRITION DIAGNOSIS:   Severe Malnutrition related to acute illness as evidenced by moderate fat depletion, severe muscle depletion, energy intake < or equal to 50% for > or equal to 5 days, percent weight loss; ongoing  GOAL:   Patient will meet greater than or equal to 90% of their needs; met with TF  MONITOR:   Diet advancement, Weight trends, PO intake, Skin, TF tolerance  REASON FOR ASSESSMENT:   Consult Enteral/tube feeding initiation and management  ASSESSMENT:   28 yo male, admitted with seizures. PMH significant for mitochondreal Leigh syndrome, pressure ulcers. Has PEG tube. Home meds list includes vitamin B-complex, ferrous sulfate, L-carnitine, Miralax. PEG exchanged 12/31.   Pt has been tolerating his continuous tube feeds at goal rate. RD consulted for home tube feeding recommendations. Patient's mother desires nocturnal feeds for home. Nocturnal home tube feeding regimen stated above. RD to continue to monitor.     Labs and  medications reviewed.   Diet Order:   Diet Order    None      EDUCATION NEEDS:   Not appropriate for education at this time  Skin:  Skin Assessment: Skin Integrity Issues: Skin Integrity Issues:: Stage I, Stage II Stage I: R foot Stage II: L buttocks Unstageable: N/A  Last BM:  1/7  Height:   Ht Readings from Last 1 Encounters:  09/29/18 '5\' 9"'  (1.753 m)    Weight:   Wt Readings from Last 1 Encounters:  10/14/18 50.8 kg    Ideal Body Weight:  72.7 kg  BMI:  Body mass index is 16.54 kg/m.  Estimated Nutritional Needs:   Kcal:  1749-4496 calories daily (30-35 kcal/kg IBW)  Protein:  87-109 gm daily (1.2-1.5 g/kg IBW)  Fluid:  >/= 2.1 L daily or per MD discretion    Corrin Parker, MS, RD, LDN Pager # [phone] After hours/ weekend pager # [phone]

## 2018-10-14 NOTE — Progress Notes (Signed)
STAFF: DO NOT ALLOW MOM TO TELL YOU THAT SHE WILL TAKE CARE OF PATIENT'S HYGIENE NEEDS! SHE IS NOT DOING A GOOD JOB AND PUTTING HIM AT RISK FOR FURTHER MASD AND PRESSURE ULCERS WHICH WERE PRESENT UPON ADMISSION.

## 2018-10-14 NOTE — Care Management Note (Addendum)
Case Management Note  Patient Details  Name: Jason Heaterhaddeus R Dyas Jr. MRN: [id number] Date of Birth: 11-06-1990  Subjective/Objective:                    Action/Plan: Spoke with mother at bedside.   At discharge she and her son will be moving in with family ( sister) [address], [place] [phone].Patient will need PTAR transportation home.   Will need MD orders and face to face for home health.  Jason Glover is interested in home health services. [facility] with Medicare.gov list. Patient currently receiving tube feeds through [facility]. She would like to change companies for tube feeds. Also patient's tube feeding pump at home does not not work. From conversion with mother unsure if she notified [facility] of same. Spoke with Jesusita Okaan with [facility] regarding pump and mother wanting to change companies.  Patient just graduated from hospice per mother.   Explained she can request CAPS through PCP and Medicaid, Jason Glover stated she is aware.   Will need tube feeding orders and order for tube feeding pump.  Provided mother with Medicare.gov home health agencies.   Jason Glover has decided to change tube feeding and TF pump to [facility] , spoke with [place] phone [phone] fax [phone] [facility]. Will need orders. Hazel requesting Nocturnal tube feeds at home. Asked dietitian for recommendations.   Expected Discharge Date:                  Expected Discharge Plan:  Home w Home Health Services  In-House Referral:     Discharge planning Services  CM Consult  Post Acute Care Choice:    Choice offered to:  Parent  DME Arranged:    DME Agency:     HH Arranged:    HH Agency:     Status of Service:  In process, will continue to follow  If discussed at Long Length of Stay Meetings, dates discussed:    Additional Comments:  Jason Glover, Jason [place] Marie, RN 10/14/2018, 12:58 PM

## 2018-10-14 NOTE — Progress Notes (Signed)
[facility]                                                                              Patient Demographics  Jason Glover, is a 28 y.o. male, DOB - 09-19-1991, BWL:[id number]  Admit date - 09/25/2018   Admitting Physician Therisa Doyne, MD  Outpatient Primary MD for the patient is Johny Blamer, MD  Outpatient specialists:   LOS - 19  days   Medical records reviewed and are as summarized below:    No chief complaint on file.      Brief summary   Patient is a 28 year old man PMH mitochondrial Leigh syndrome, pressure ulcers, feeding tubes placement, [facility] after presenting with acute encephalopathy, fever and uncontrollable movements right upper extremity. At baseline able to communicate uses a motorized wheelchair. Admitted for further evaluation and treatment of focal status epilepticus and fever. Seen by neurology, stat EEG showed no epileptiform discharges and patient loaded with Vimpat. Patient followed by neurology, long-term EEG was negative but cannot rule out focal status epilepticus. Despite being no longer febrile, being treated with antibiotics and having extensive work-up as well as consultation with infectious disease in 3 antiepileptics as per neurology there is been no significant change in his condition.  Assessment & Plan   Principal problem Focal status epilepticus, seizures, acute metabolic encephalopathy with underlying CP. -Initial EG and prolonged EEG showed no evidence of seizures, patient was started on thiamine per neurology -MRI brain showed restricted diffusion left frontal cortex which could represent ongoing seizures. -Overnight on 1/5, had seizure-like spells with associated arrhythmia, seen by neurology, felt Vimpat dose was not being administered correctly. -Neurology recommended continue Keppra, Vimpat and thiamine -Due to transaminitis, Dilantin held, LFTs are improving -  EEG 1/6, moderately abnormal continuous video due to generalized background slowing with some runs of GRDA, no definitive epileptiform discharges. LFTs improving, ammonia normal  Transaminitis -Continue to hold Dilantin, LFTs improving. - doubt secondary to tube feeds as patient has been on long-term artificial feeding.  Severe protein calorie malnutrition -Continues feeding, appreciate dietitian help  FUO -On admission, treated empirically with acyclovir, aztreonam and vancomycin. -Mother had declined LP, chest x-ray, UA negative, influenza negative, subsequently antibiotics were discontinued.  ID was consulted. -Blood cultures remain negative, felt possibly due to antiepileptics or drug fever or central fever from Leigh syndrome -ID recommended stopping antibiotics and monitoring fever curve  Sinus tachycardia -Patient had sinus tachycardia during this hospitalization -Heart rate better, continue metoprolol  -EKG changes, troponin was less than 0.03, no complaints of any chest pain. -No prior echo in the system, 2D echo pending  Mitochondrial leigh syndrome Continue levocarnitine  PEG tube malfunction On admission 12/19, exchanged on 12/31  Angioedema Resolved, patient developed angioedema on 12/27 with upper lip, right earlobe edema after receiving IV Ativan  Stage II left trochanteric pressure ulcer, stage I ulcers left lateral malleolus right foot Wound care reconsulted, per mother, wound is worsening   Code Status: Full CODE STATUS DVT Prophylaxis:  SCD's Family Communication: Discussed in detail with the patient, all imaging results, lab results explained to the patient's mother at bedside  Disposition Plan: When cleared by neurology  Time Spent in minutes 35 minutes  Procedures:  EEG Cortrack placement 12/23 PEG tube exchanged on 12/31  Consultants:   Neurology  Antimicrobials:      Medications  Scheduled Meds: . free water  150 mL Per Tube Q8H    . lacosamide  200 mg Per Tube BID  . levETIRAcetam  1,500 mg Per Tube BID  . levOCARNitine  250 mg Per Tube Daily  . metoprolol tartrate  12.5 mg Per Tube BID  . nutrition supplement (JUVEN)  1 packet Per Tube BID BM   Continuous Infusions: . sodium chloride 10 mL/hr at 10/11/18 1135  . feeding supplement (JEVITY 1.2 CAL) 1,000 mL (10/13/18 1905)   PRN Meds:.sodium chloride, acetaminophen (TYLENOL) oral liquid 160 mg/5 mL, acetaminophen, diphenhydrAMINE, lidocaine, loperamide HCl, oxyCODONE-acetaminophen   Antibiotics   Anti-infectives (From admission, onward)   Start     Dose/Rate Route Frequency Ordered Stop   10/07/18 1500  vancomycin (VANCOCIN) IVPB 750 mg/150 ml premix  Status:  Discontinued     750 mg 150 mL/hr over 60 Minutes Intravenous Every 12 hours 10/07/18 1426 10/08/18 1305   10/05/18 0400  vancomycin (VANCOCIN) IVPB 750 mg/150 ml premix  Status:  Discontinued     750 mg 150 mL/hr over 60 Minutes Intravenous Every 8 hours 10/04/18 1946 10/07/18 1426   10/04/18 2000  vancomycin (VANCOCIN) IVPB 1000 mg/200 mL premix     1,000 mg 200 mL/hr over 60 Minutes Intravenous  Once 10/04/18 1946 10/04/18 2200   10/04/18 2000  meropenem (MERREM) 1 g in sodium chloride 0.9 % 100 mL IVPB  Status:  Discontinued     1 g 200 mL/hr over 30 Minutes Intravenous Every 8 hours 10/04/18 1946 10/08/18 1305   09/29/18 1400  meropenem (MERREM) 1 g in sodium chloride 0.9 % 100 mL IVPB     1 g 200 mL/hr over 30 Minutes Intravenous Every 8 hours 09/29/18 1306 10/02/18 2130   09/26/18 1100  vancomycin (VANCOCIN) 500 mg in sodium chloride 0.9 % 100 mL IVPB  Status:  Discontinued     500 mg 100 mL/hr over 60 Minutes Intravenous Every 8 hours 09/26/18 1015 09/28/18 1224   09/25/18 1700  vancomycin (VANCOCIN) IVPB 1000 mg/200 mL premix     1,000 mg 200 mL/hr over 60 Minutes Intravenous  Once 09/25/18 1608 09/25/18 2000   09/25/18 1700  aztreonam (AZACTAM) 2 g in sodium chloride 0.9 % 100 mL IVPB   Status:  Discontinued     2 g 200 mL/hr over 30 Minutes Intravenous Every 8 hours 09/25/18 1608 09/29/18 1301   09/25/18 1700  acyclovir (ZOVIRAX) 450 mg in dextrose 5 % 100 mL IVPB  Status:  Discontinued     450 mg 109 mL/hr over 60 Minutes Intravenous Every 8 hours 09/25/18 1608 09/29/18 1301        Subjective:   Almin Manwarren was seen and examined today.  Sleeping, mother at the bedside, states had no acute issues overnight.  No seizures.  Concerned about decubitus pressure wounds. Objective:   Vitals:   10/14/18 0500 10/14/18 0700 10/14/18 1100 10/14/18 1210  BP: 111/79 117/83  112/75  Pulse:  (!) 105 (!) 119   Resp: 13 15  12   Temp: 98.3 F (36.8 C)   98 F (36.7 C)  TempSrc: Axillary Axillary  Axillary  SpO2:  96%  98%  Weight: 50.8 kg     Height:  Intake/Output Summary (Last 24 hours) at 10/14/2018 1251 Last data filed at 10/14/2018 1025 Gross per 24 hour  Intake 5194.07 ml  Output 1650 ml  Net 3544.07 ml     Wt Readings from Last 3 Encounters:  10/14/18 50.8 kg    Physical Exam  General: Sleepy  Eyes:   HEENT:    Cardiovascular: S1 S2 clear,, RRR. No pedal edema b/l  Respiratory: CTAB, no wheezing, rales or rhonchi  Gastrointestinal: Soft, nontender, nondistended, NBS  Ext: Contractures upper and lower extremities  Neuro: no new deficits  Musculoskeletal: No cyanosis, clubbing  Skin:Stage II left trochanteric pressure ulcer, stage I ulcers left lateral malleolus right foot  Psych: Sleeping     Data Reviewed:  I have personally reviewed following labs and imaging studies  Micro Results Recent Results (from the past 240 hour(s))  Culture, Urine     Status: None   Collection Time: 10/04/18  6:59 PM  Result Value Ref Range Status   Specimen Description URINE, RANDOM  Final   Special Requests NONE  Final   Culture   Final    NO GROWTH Performed at [facility], [address] [place], [place] [address]    Report Status  10/07/2018 FINAL  Final  Culture, blood (Routine X 2) w Reflex to ID Panel     Status: None   Collection Time: 10/04/18  8:13 PM  Result Value Ref Range Status   Specimen Description BLOOD LEFT ANTECUBITAL  Final   Special Requests   Final    BOTTLES DRAWN AEROBIC AND ANAEROBIC Blood Culture adequate volume   Culture   Final    NO GROWTH 5 DAYS Performed at [facility], [address] [place], [place] [address]    Report Status 10/09/2018 FINAL  Final  Culture, blood (Routine X 2) w Reflex to ID Panel     Status: None   Collection Time: 10/04/18  8:18 PM  Result Value Ref Range Status   Specimen Description BLOOD LEFT WRIST  Final   Special Requests   Final    BOTTLES DRAWN AEROBIC AND ANAEROBIC Blood Culture adequate volume   Culture   Final    NO GROWTH 5 DAYS Performed at [facility], 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 Electronically Signed   By: Awilda Metroourtnay  Bloomer M.D.   On: 09/26/2018 23:29   Result Date: 09/26/2018 CLINICAL DATA:  Encephalopathy. History of Leigh syndrome/mitochondrial disorder. EXAM: MRI HEAD WITHOUT AND WITH CONTRAST TECHNIQUE: Multiplanar, multiecho pulse sequences of the brain and surrounding structures were obtained without and with intravenous contrast. CONTRAST:  4 cc Gadavist COMPARISON:  MRI head November 06, 2005 FINDINGS: Moderately motion degraded examination. INTRACRANIAL CONTENTS: Rounded reduced diffusion LEFT frontal cortex with T2 shine and central nonenhancing low ADC values with enhancement. Symmetric FLAIR T2 hyperintense signal and volume loss bilateral basal ganglia and hippocampi. Severe pontocerebellar atrophy, progressed from 2007. No midline shift, mass effect or masses. No hydrocephalus. No abnormal  extra-axial fluid collections or extra-axial enhancement. VASCULAR: Normal major intracranial vascular flow voids present at skull base. SKULL AND UPPER CERVICAL SPINE: No abnormal sellar expansion. No suspicious calvarial bone marrow signal. Craniocervical junction maintained. SINUSES/ORBITS: Mild LEFT maxillary sinus mucosal thickening. Mastoid air cells are well aerated.  Included ocular globes and orbital contents are non-suspicious. OTHER: None. IMPRESSION: 1. Moderately motion degraded examination. LEFT frontal cortical lesion with imaging characteristics of subacute Leigh lesion or other demyelination. 2. Symmetrically atrophic basal ganglia and hippocampi associated seen with Leigh syndrome though, other neurodegenerative syndromes can have this appearance. 3. Severe Pontocerebellar atrophy progressed from 2007. Electronically Signed: By: Awilda Metroourtnay  Bloomer M.D. On: 09/26/2018 23:19   Ir Replc Gastro/colonic Tube Percut W/fluoro  Result Date: 10/07/2018 INDICATION: Malfunctioning gastrostomy tube.  Plan for gastrostomy tube exchange EXAM: GASTROSTOMY TUBE EXCHANGE WITH FLUOROSCOPY MEDICATIONS: None ANESTHESIA/SEDATION: None CONTRAST:  5 mL-administered into the gastric lumen. FLUOROSCOPY TIME:  Fluoroscopy Time 12 seconds, 0.4 mGy. COMPLICATIONS: None immediate. PROCEDURE: Informed written consent was obtained from the patient's mother after a thorough discussion of the procedural risks, benefits and alternatives. All questions were addressed. A timeout was performed prior to the initiation of the procedure. The existing catheter was prepped and draped. Old catheter balloon was no longer inflated and the tube was easily removed with traction. A new 16 French, 3 cm MIC-KEY button gastrostomy tube was easily advanced in the stomach. Balloon was inflated with 5 mL of saline. Contrast injection confirmed placement in the stomach. IMPRESSION: Successful exchange of the MIC-KEY button gastrostomy tube.  Electronically Signed   By: Richarda OverlieAdam  Henn M.D.   On: 10/07/2018 17:20   Dg Chest Port 1 View  Result Date: 09/26/2018 CLINICAL DATA:  Fever EXAM: PORTABLE CHEST 1 VIEW COMPARISON:  03/26/2010 FINDINGS: Heart and mediastinal contours are within normal limits. No focal opacities or effusions. No acute bony abnormality. IMPRESSION: No active disease. Electronically Signed   By: Charlett NoseKevin  Dover M.D.   On: 09/26/2018 07:32   Dg Chest Port 1v Same Day  Result Date: 10/04/2018 CLINICAL DATA:  Acute onset of fever. EXAM: PORTABLE CHEST 1 VIEW COMPARISON:  Chest radiograph performed 09/26/2018 FINDINGS: The patient's enteric tube is seen extending about the antrum of the stomach. There is elevation of the right hemidiaphragm. The lungs appear grossly clear. No focal consolidation, pleural effusion or pneumothorax is seen. The cardiomediastinal silhouette is normal in size. No acute osseous abnormalities are identified. Evaluation is mildly suboptimal due to patient rotation. IMPRESSION: No acute cardiopulmonary process seen; elevation of the right hemidiaphragm. Electronically Signed   By: Roanna RaiderJeffery  Chang M.D.   On: 10/04/2018 21:57   Dg Abd Portable 1v  Result Date: 09/29/2018 CLINICAL DATA:  Feeding tube placement EXAM: PORTABLE ABDOMEN - 1 VIEW COMPARISON:  None. FINDINGS: The tip of the weighted enteric tube projects in the expected location of the gastroduodenal junction. No dilated small bowel. There is severe left convex scoliosis. Visualized lung bases are clear. IMPRESSION: Tip of the weighted enteric tube projects in the expected location of the gastroduodenal junction. Electronically Signed   By: Deatra RobinsonKevin  Herman M.D.   On: 09/29/2018 16:58    Lab Data:  CBC: Recent Labs  Lab 10/13/18 0559 10/14/18 0551  WBC 9.3 7.4  HGB 10.3* 10.7*  HCT 32.0* 33.6*  MCV 88.2 88.9  PLT 377 397   Basic Metabolic Panel: Recent Labs  Lab 10/10/18 0357 10/11/18 0437 10/11/18 2101 10/13/18 0559 10/14/18 0551   NA 137 139 137 136 137  K 4.2 4.2 3.5 3.6 4.0  CL 108 112* 112* 109 109  CO2 20* 19* 16* 17* 19*  GLUCOSE 108* 87 119* 119* 116*  BUN 24* 19 25* 17 18  CREATININE 0.48* 0.39* 0.46* 0.44* 0.52*  CALCIUM 8.6* 8.6* 8.5* 8.7* 9.0  GFR: Estimated Creatinine Clearance: 98.8 mL/min (A) (by C-G formula based on SCr of 0.52 mg/dL (L)). Liver Function Tests: Recent Labs  Lab 10/10/18 0357 10/11/18 0437 10/11/18 2101 10/13/18 0559 10/13/18 2155  AST 85* 84* 93* 67* 42*  ALT 188* 202* 204* 176* 141*  ALKPHOS 110 107 113 118 111  BILITOT 0.2* 0.3 0.4 0.4 0.4  PROT 5.9* 6.0* 6.0* 6.0* 6.2*  ALBUMIN 2.3* 2.4* 2.3* 2.4* 2.4*   No results for input(s): LIPASE, AMYLASE in the last 168 hours. Recent Labs  Lab 10/14/18 0551  AMMONIA 29   Coagulation Profile: No results for input(s): INR, PROTIME in the last 168 hours. Cardiac Enzymes: Recent Labs  Lab 10/11/18 2101  TROPONINI <0.03   BNP (last 3 results) No results for input(s): PROBNP in the last 8760 hours. HbA1C: No results for input(s): HGBA1C in the last 72 hours. CBG: Recent Labs  Lab 10/13/18 1626 10/13/18 2031 10/14/18 0000 10/14/18 0537 10/14/18 1206  GLUCAP 94 111* 122* 98 120*   Lipid Profile: No results for input(s): CHOL, HDL, LDLCALC, TRIG, CHOLHDL, LDLDIRECT in the last 72 hours. Thyroid Function Tests: No results for input(s): TSH, T4TOTAL, FREET4, T3FREE, THYROIDAB in the last 72 hours. Anemia Panel: No results for input(s): VITAMINB12, FOLATE, FERRITIN, TIBC, IRON, RETICCTPCT in the last 72 hours. Urine analysis:    Component Value Date/Time   COLORURINE YELLOW 10/06/2018 1025   APPEARANCEUR CLEAR 10/06/2018 1025   LABSPEC 1.015 10/06/2018 1025   PHURINE 8.0 10/06/2018 1025   GLUCOSEU NEGATIVE 10/06/2018 1025   HGBUR NEGATIVE 10/06/2018 1025   BILIRUBINUR NEGATIVE 10/06/2018 1025   KETONESUR NEGATIVE 10/06/2018 1025   PROTEINUR NEGATIVE 10/06/2018 1025   NITRITE NEGATIVE 10/06/2018 1025    LEUKOCYTESUR NEGATIVE 10/06/2018 1025     Yolande Skoda M.D. [facility] 10/14/2018, 12:51 PM  Pager: [phone] Between 7am to 7pm - call Pager - [phone]  After 7pm go to [email or web address] - password [email or web address]  Call night coverage person covering after 7pm

## 2018-10-15 DIAGNOSIS — R5081 Fever presenting with conditions classified elsewhere: Secondary | ICD-10-CM

## 2018-10-15 LAB — COMPREHENSIVE METABOLIC PANEL
ALT: 123 U/L — ABNORMAL HIGH (ref 0–44)
AST: 36 U/L (ref 15–41)
Albumin: 2.6 g/dL — ABNORMAL LOW (ref 3.5–5.0)
Alkaline Phosphatase: 117 U/L (ref 38–126)
Anion gap: 9 (ref 5–15)
BUN: 19 mg/dL (ref 6–20)
CHLORIDE: 108 mmol/L (ref 98–111)
CO2: 19 mmol/L — ABNORMAL LOW (ref 22–32)
Calcium: 9.3 mg/dL (ref 8.9–10.3)
Creatinine, Ser: 0.52 mg/dL — ABNORMAL LOW (ref 0.61–1.24)
GFR calc Af Amer: >60 mL/min (ref >60)
GFR calc non Af Amer: >60 mL/min (ref >60)
GLUCOSE: 119 mg/dL — AB (ref 70–99)
Potassium: 4.2 mmol/L (ref 3.5–5.1)
SODIUM: 136 mmol/L (ref 135–145)
TOTAL PROTEIN: 6.8 g/dL (ref 6.5–8.1)
Total Bilirubin: 0.2 mg/dL — ABNORMAL LOW (ref 0.3–1.2)

## 2018-10-15 LAB — CBC
HCT: 35.9 % — ABNORMAL LOW (ref 39.0–52.0)
Hemoglobin: 11.5 g/dL — ABNORMAL LOW (ref 13.0–17.0)
MCH: 28.3 pg (ref 26.0–34.0)
MCHC: 32 g/dL (ref 30.0–36.0)
MCV: 88.2 fL (ref 80.0–100.0)
PLATELETS: 431 10*3/uL — AB (ref 150–400)
RBC: 4.07 MIL/uL — ABNORMAL LOW (ref 4.22–5.81)
RDW: 16.2 % — ABNORMAL HIGH (ref 11.5–15.5)
WBC: 9 10*3/uL (ref 4.0–10.5)
nRBC: 0 % (ref 0.0–0.2)

## 2018-10-15 LAB — GLUCOSE, CAPILLARY
GLUCOSE-CAPILLARY: 109 mg/dL — AB (ref 70–99)
Glucose-Capillary: 107 mg/dL — ABNORMAL HIGH (ref 70–99)
Glucose-Capillary: 110 mg/dL — ABNORMAL HIGH (ref 70–99)
Glucose-Capillary: 116 mg/dL — ABNORMAL HIGH (ref 70–99)
Glucose-Capillary: 142 mg/dL — ABNORMAL HIGH (ref 70–99)
Glucose-Capillary: 89 mg/dL (ref 70–99)

## 2018-10-15 MED ORDER — METOPROLOL TARTRATE 25 MG/10 ML ORAL SUSPENSION: 12.5000 mg | Freq: Two times a day (BID) | ORAL | 3 refills | Status: AC

## 2018-10-15 MED ORDER — LACOSAMIDE 200 MG PO TABS: 200.0000 mg | ORAL_TABLET | Freq: Two times a day (BID) | ORAL | 4 refills | Status: AC

## 2018-10-15 MED ORDER — VITAMIN B-1 100 MG PO TABS: 100.0000 mg | ORAL_TABLET | Freq: Every day | ORAL | 3 refills | Status: AC

## 2018-10-15 MED ORDER — FREE WATER: 150.0000 mL | Freq: Three times a day (TID) | Status: AC

## 2018-10-15 MED ORDER — LEVOCARNITINE 1 GM/10ML PO SOLN: 250.0000 mg | Freq: Every day | ORAL | 12 refills | Status: AC

## 2018-10-15 MED ORDER — JEVITY 1.2 CAL PO LIQD: 1000.0000 mL | ORAL | 0 refills | Status: AC

## 2018-10-15 MED ORDER — LEVETIRACETAM 100 MG/ML PO SOLN: 1500.0000 mg | Freq: Two times a day (BID) | ORAL | 12 refills | Status: AC

## 2018-10-15 MED ORDER — DIPHENHYDRAMINE HCL 12.5 MG/5ML PO ELIX: 25.0000 mg | ORAL_SOLUTION | Freq: Four times a day (QID) | ORAL | 0 refills | Status: AC | PRN

## 2018-10-15 NOTE — Progress Notes (Signed)
Patient set to discharge. Waiting on wound care Nurse to reassess and for a scheduled time for PTAR to transfer.

## 2018-10-15 NOTE — Progress Notes (Signed)
Reason for consult: Leigh Syndrome, Seizure like activity   Subjective: No significant change from yesterday.  Patient has no further seizure-like activity.   ROS:  Unable to obtain due to poor mental status  Examination  Vital signs in last 24 hours: Temp:  [98 F (36.7 C)-99.3 F (37.4 C)] 98.9 F (37.2 C) (01/08 0400) Pulse Rate:  [99-119] 99 (01/08 0000) Resp:  [12] 12 (01/08 0000) BP: (112-122)/(75-82) 119/82 (01/08 0000) SpO2:  [98 %-100 %] 99 % (01/08 0000) Weight:  [49.1 kg] 49.1 kg (01/08 0500)  General: lying in bed comfortably  CVS: pulse-normal rate and rhythm RS: breathing comfortably Extremities: normal   Neuro: MS: Patient asleep but easily arousable, remains non verbal, not following commands today CN: pupils equal and reactive,  EOMI, face symmetric, tongue midline, normal sensation over face, Motor: withdraws in all 4 extremities, increased tone in lower extremities  Coordination: not tested  Gait: not tested  Basic Metabolic Panel: Recent Labs  Lab 10/11/18 0437 10/11/18 2101 10/13/18 0559 10/14/18 0551 10/15/18 0322  NA 139 137 136 137 136  K 4.2 3.5 3.6 4.0 4.2  CL 112* 112* 109 109 108  CO2 19* 16* 17* 19* 19*  GLUCOSE 87 119* 119* 116* 119*  BUN 19 25* 17 18 19   CREATININE 0.39* 0.46* 0.44* 0.52* 0.52*  CALCIUM 8.6* 8.5* 8.7* 9.0 9.3    CBC: Recent Labs  Lab 10/13/18 0559 10/14/18 0551 10/15/18 0322  WBC 9.3 7.4 9.0  HGB 10.3* 10.7* 11.5*  HCT 32.0* 33.6* 35.9*  MCV 88.2 88.9 88.2  PLT 377 397 431*     Coagulation Studies: No results for input(s): LABPROT, INR in the last 72 hours.  Imaging Reviewed:     ASSESSMENT AND PLAN  28 year old male patient with history of mitochondrial leigh syndrome, seizure disorder who presented with rhythmic right upper extremity jerking and left facial twitching for several days in the setting of fever and misplaced PEG tube.  Patient was started on 3 antiepileptics for concern of  partial status epilepticus.  Long-term EEG showed no epileptiform discharges.  MRI brain shows  area of restriction diffusion in left frontal cortex -likely representing seizure focus.    Patient was treated with antibiotics for fever, ID consulted and no source of infection was found.  Mother refused LP.  Patient has remained afebrile and antibiotics have been discontinued. PEG tube has been exchanged on 12/31.  He is tolerating tube feeds well.  Was reconsulted for elevated LFTs and Dilantin was discontinued.  Patient had a brief seizure on 10/11/2018.  Patient underwent a long-term EEG again which showed no electrographic seizures,   LFTs have been trending downwards.  Ammonia is normal at 29. Per mother patient has been improving with intermittent speech (not witnessed by any of the neurohospitalist so far).  Does not follow commands by raising his thumbs.  Still not at his baseline but according to the mother he has done this in the past that usually recovers over weeks to months.  At baseline appears to be more verbal, bedbound.      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  Pt needs to follow-up with Dr. Velna Ochs Takeyla Million [facility] Pager Number [phone] For questions after 7pm please refer to AMION to reach the Neurologist on call

## 2018-10-15 NOTE — Discharge Summary (Signed)
Physician Discharge Summary   Patient ID: Jason Heaterhaddeus R Blitzer Jr. MRN: [id number] DOB/AGE: 08-Jan-1991 28 y.o.  Admit date: 09/25/2018 Discharge date: 10/15/2018  Primary Care Physician:  Jason BlamerHarris, [name], MD   Recommendations for Outpatient Follow-up:  1. Follow up with PCP in 1-2 weeks 2. Follow LFTs in 1 week 3. Wound care as per discharge instructions  Home Health: Home health HHA, RN, PT Equipment/Devices:   Discharge Condition: Guarded CODE STATUS: FULL  Diet recommendation: Tube feeds   Discharge Diagnoses:    Principal discharge diagnosis Focal status epilepticus, seizures with acute metabolic encephalopathy  Secondary diagnosis Cerebral palsy with mitochondrial leigh syndrome Transaminitis Severe protein calorie malnutrition FUO Sinus tachycardia Angioedema PEG tube malfunction Stage II left trochanteric pressure ulcer Stage I ulcers left lateral malleolus right foot  Consults: Neurology Infectious disease   Allergies:   Allergies  Allergen Reactions  . Ativan [Lorazepam]     Facial swelling  . Rocephin [Ceftriaxone] Hives     DISCHARGE MEDICATIONS: Allergies as of 10/15/2018      Reactions   Ativan [lorazepam]    Facial swelling   Rocephin [ceftriaxone] Hives      Medication List    STOP taking these medications   B COMPLETE Tabs   ferrous sulfate 325 (65 FE) MG EC tablet   L-Carnitine 250 MG Caps   polyethylene glycol packet Commonly known as:  MIRALAX / GLYCOLAX     TAKE these medications   diphenhydrAMINE 12.5 MG/5ML elixir Commonly known as:  BENADRYL Place 10 mLs (25 mg total) into feeding tube every 6 (six) hours as needed for itching or allergies.   feeding supplement (JEVITY 1.2 CAL) Liqd Place 1,000 mLs into feeding tube continuous.   free water Soln Place 150 mLs into feeding tube every 8 (eight) hours.   lacosamide 200 MG Tabs tablet Commonly known as:  VIMPAT Place 1 tablet (200 mg total) into feeding tube 2 (two)  times daily.   levETIRAcetam 100 MG/ML solution Commonly known as:  KEPPRA Place 15 mLs (1,500 mg total) into feeding tube 2 (two) times daily.   levOCARNitine 1 GM/10ML solution Commonly known as:  CARNITOR Place 2.5 mLs (250 mg total) into feeding tube daily.   metoprolol tartrate 25 mg/10 mL Susp Commonly known as:  LOPRESSOR Place 5 mLs (12.5 mg total) into feeding tube 2 (two) times daily.   thiamine 100 MG tablet Commonly known as:  VITAMIN B-1 Take 1 tablet (100 mg total) by mouth daily.        Brief H and P: For complete details please refer to admission H and P, but in briefPatient is a 28 year old man PMH mitochondrial Leigh syndrome, pressure ulcers, feeding tubes placement, transferred from [facility] after presenting with acute encephalopathy, fever and uncontrollable movements right upper extremity. At baseline able to communicate uses a motorized wheelchair. Admitted for further evaluation and treatment of focal status epilepticus and fever. Seen by neurology, stat EEG showed no epileptiform discharges and patient loaded with Vimpat. Patient followed by neurology, long-term EEG was negative but cannot rule out focal status epilepticus. Despite being no longer febrile, being treated with antibiotics and having extensive work-up as well as consultation with infectious disease in 3 antiepileptics as per neurology there is been no significant change in his condition.   Hospital Course:  Focal status epilepticus, seizures, acute metabolic encephalopathy with underlying CP. -Initial EG and prolonged EEG showed no evidence of seizures, patient was started on thiamine per neurology -MRI brain showed restricted  diffusion left frontal cortex which could represent ongoing seizures. -Overnight on 1/5, had seizure-like spells with associated arrhythmia, seen by neurology, felt Vimpat dose was not being administered correctly. -Neurology recommended continue  Keppra, Vimpat and thiamine -Due to transaminitis, discontinued, LFTs are improving.  Ammonia normal. - EEG 1/6, moderately abnormal continuous video due to generalized background slowing with some runs of GRDA, no definitive epileptiform discharges. -Discussed with patient's mother at the bedside, patient is at baseline mental status.  No significant change from yesterday and no further seizure-like activities.  Cleared by neurology for discharge.  Transaminitis -Continue to hold Dilantin, LFTs improving. - doubt secondary to tube feeds as patient has been on long-term artificial feeding.  Severe protein calorie malnutrition -Continues feeding per home regimen  FUO -On admission, treated empirically with acyclovir, aztreonam and vancomycin. -Mother had declined LP, chest x-ray, UA negative, influenza negative, subsequently antibiotics were discontinued.  ID was consulted. -Blood cultures remain negative, felt possibly due to antiepileptics or drug fever or central fever from Leigh syndrome -Consulted and recommended stopping antibiotics and monitoring fever curve, has remained stable  Sinus tachycardia -Patient had sinus tachycardia during this hospitalization -Heart rate better, continue metoprolol  -EKG changes, troponin was less than 0.03, no complaints of any chest pain. -2D echo showed EF of 60 to 65% with normal wall motion  Mitochondrial leigh syndrome Continue levocarnitine  PEG tube malfunction IR was consulted and gastrostomy tube was exchanged on 12/31  Angioedema Resolved, patient developed angioedema on 12/27 with upper lip, right earlobe edema after receiving IV Ativan  Stage II left trochanteric pressure ulcer, stage I ulcers left lateral malleolus right foot Wound care instructions placed    Day of Discharge S: No seizure-like activity overnight, mother hoping to get patient home today  BP 126/76   Pulse (!) 123   Temp 100.3 F (37.9 C) (Axillary)    Resp 18   Ht 5\' 9"  (1.753 m)   Wt 49.1 kg   SpO2 99%   BMI 15.99 kg/m   Physical Exam: General: Sleepy, per mother was watching TV, close to his baseline HEENT: anicteric sclera, pupils reactive to light and accommodation CVS: S1-S2 clear no murmur rubs or gallops Chest: clear to auscultation bilaterally, no wheezing rales or rhonchi Abdomen: soft nontender, nondistended, normal bowel sounds Extremities: no cyanosis, clubbing or edema noted bilaterally, contractures  neuro: Unable to assess   The results of significant diagnostics from this hospitalization (including imaging, microbiology, ancillary and laboratory) are listed below for reference.      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 Electronically Signed   By: Awilda Metroourtnay  Bloomer M.D.   On: 09/26/2018 23:29   Result Date: 09/26/2018 CLINICAL DATA:  Encephalopathy. History of Leigh syndrome/mitochondrial disorder. EXAM: MRI HEAD WITHOUT AND WITH CONTRAST TECHNIQUE: Multiplanar, multiecho pulse sequences of the brain and surrounding structures were obtained without and with intravenous contrast. CONTRAST:  4 cc Gadavist COMPARISON:  MRI head November 06, 2005 FINDINGS: Moderately motion degraded examination. INTRACRANIAL CONTENTS: Rounded reduced diffusion LEFT frontal cortex with T2 shine and central nonenhancing low ADC values with enhancement. Symmetric FLAIR T2 hyperintense signal and volume loss bilateral basal ganglia and hippocampi. Severe pontocerebellar atrophy, progressed from 2007. No midline shift, mass effect or masses. No hydrocephalus. No abnormal extra-axial fluid collections or extra-axial enhancement. VASCULAR: Normal major intracranial vascular flow voids present at skull base. SKULL AND UPPER  CERVICAL SPINE: No abnormal sellar expansion. No suspicious calvarial bone marrow signal.  Craniocervical junction maintained. SINUSES/ORBITS: Mild LEFT maxillary sinus mucosal thickening. Mastoid air cells are well aerated. Included ocular globes and orbital contents are non-suspicious. OTHER: None. IMPRESSION: 1. Moderately motion degraded examination. LEFT frontal cortical lesion with imaging characteristics of subacute Leigh lesion or other demyelination. 2. Symmetrically atrophic basal ganglia and hippocampi associated seen with Leigh syndrome though, other neurodegenerative syndromes can have this appearance. 3. Severe Pontocerebellar atrophy progressed from 2007. Electronically Signed: By: Awilda Metro M.D. On: 09/26/2018 23:19   Ir Replc Gastro/colonic Tube Percut W/fluoro  Result Date: 10/07/2018 INDICATION: Malfunctioning gastrostomy tube.  Plan for gastrostomy tube exchange EXAM: GASTROSTOMY TUBE EXCHANGE WITH FLUOROSCOPY MEDICATIONS: None ANESTHESIA/SEDATION: None CONTRAST:  5 mL-administered into the gastric lumen. FLUOROSCOPY TIME:  Fluoroscopy Time 12 seconds, 0.4 mGy. COMPLICATIONS: None immediate. PROCEDURE: Informed written consent was obtained from the patient's mother after a thorough discussion of the procedural risks, benefits and alternatives. All questions were addressed. A timeout was performed prior to the initiation of the procedure. The existing catheter was prepped and draped. Old catheter balloon was no longer inflated and the tube was easily removed with traction. A new 16 French, 3 cm MIC-KEY button gastrostomy tube was easily advanced in the stomach. Balloon was inflated with 5 mL of saline. Contrast injection confirmed placement in the stomach. IMPRESSION: Successful exchange of the MIC-KEY button gastrostomy tube. Electronically Signed   By: Richarda Overlie M.D.   On: 10/07/2018 17:20   Dg Chest Port 1 View  Result Date: 09/26/2018 CLINICAL DATA:  Fever EXAM: PORTABLE CHEST 1 VIEW COMPARISON:  03/26/2010 FINDINGS: Heart and mediastinal contours are within  normal limits. No focal opacities or effusions. No acute bony abnormality. IMPRESSION: No active disease. Electronically Signed   By: Charlett Nose M.D.   On: 09/26/2018 07:32   Dg Chest Port 1v Same Day  Result Date: 10/04/2018 CLINICAL DATA:  Acute onset of fever. EXAM: PORTABLE CHEST 1 VIEW COMPARISON:  Chest radiograph performed 09/26/2018 FINDINGS: The patient's enteric tube is seen extending about the antrum of the stomach. There is elevation of the right hemidiaphragm. The lungs appear grossly clear. No focal consolidation, pleural effusion or pneumothorax is seen. The cardiomediastinal silhouette is normal in size. No acute osseous abnormalities are identified. Evaluation is mildly suboptimal due to patient rotation. IMPRESSION: No acute cardiopulmonary process seen; elevation of the right hemidiaphragm. Electronically Signed   By: Roanna Raider M.D.   On: 10/04/2018 21:57   Dg Abd Portable 1v  Result Date: 09/29/2018 CLINICAL DATA:  Feeding tube placement EXAM: PORTABLE ABDOMEN - 1 VIEW COMPARISON:  None. FINDINGS: The tip of the weighted enteric tube projects in the expected location of the gastroduodenal junction. No dilated small bowel. There is severe left convex scoliosis. Visualized lung bases are clear. IMPRESSION: Tip of the weighted enteric tube projects in the expected location of the gastroduodenal junction. Electronically Signed   By: Deatra Robinson M.D.   On: 09/29/2018 16:58      LAB RESULTS: Basic Metabolic Panel: Recent Labs  Lab 10/14/18 0551 10/15/18 0322  NA 137 136  K 4.0 4.2  CL 109 108  CO2 19* 19*  GLUCOSE 116* 119*  BUN 18 19  CREATININE 0.52* 0.52*  CALCIUM 9.0 9.3   Liver Function Tests: Recent Labs  Lab 10/13/18 2155 10/15/18 0322  AST 42* 36  ALT 141* 123*  ALKPHOS 111 117  BILITOT 0.4 0.2*  PROT 6.2*  6.8  ALBUMIN 2.4* 2.6*   No results for input(s): LIPASE, AMYLASE in the last 168 hours. Recent Labs  Lab 10/14/18 0551  AMMONIA 29    CBC: Recent Labs  Lab 10/14/18 0551 10/15/18 0322  WBC 7.4 9.0  HGB 10.7* 11.5*  HCT 33.6* 35.9*  MCV 88.9 88.2  PLT 397 431*   Cardiac Enzymes: Recent Labs  Lab 10/11/18 2101  TROPONINI <0.03   BNP: Invalid input(s): POCBNP CBG: Recent Labs  Lab 10/15/18 0748 10/15/18 1203  GLUCAP 116* 142*      Disposition and Follow-up: Discharge Instructions    Discharge instructions   Complete by:  As directed    Apply skin prep around the wound on the left trochanter.  Allow to air dry.  Place a hydrocolloid over the wound. Date the dressing. Change every 3 - 5 day and prn.   Increase activity slowly   Complete by:  As directed        DISPOSITION: Home with home health   DISCHARGE FOLLOW-UP Follow-up Information    Deetta Perla, MD. Schedule an appointment as soon as possible for a visit in 10 day(s).   Specialties:  Pediatrics, Radiology Why:  for hospital follow-up Contact information: [address] [place] [address] [phone]            Time coordinating discharge:  45-minutes  Signed:   Thad Ranger M.D. [facility] 10/15/2018, 12:13 PM Pager: [phone]

## 2018-10-15 NOTE — Progress Notes (Signed)
Nurse called [facility] for patient transfer. [facility] told Nurse that anything over 50 miles requires patient or family to pay out of pocket and up front, regardless of Insurance.   Called Steward Drone in case management and spoke with her regarding.  Spoke with mom regarding the same thing. Mom states they tried to tell her the same coming from [facility] but a doctor wrote a note and it was waived.   Mom will contact [facility] in the morning. She does have her Zenaida Niece here.   Staff: Please call [facility] ASAP once this is resolved so that patient has enough time for transportation (drive is ~ 2 hours).

## 2018-10-15 NOTE — Progress Notes (Signed)
Spoke to [place] about patient [facility] time and she will speak with case management after their meeting in order to set everything in place.   Home in [place] has seen/spoken to patient guardian.   Wound care has also seen/spoken to patient guardian.

## 2018-10-15 NOTE — Social Work (Addendum)
CSW contacted by pt RN. [facility] papers completed on chart. RN to call [phone].  Pt [place] go [place] [address] [place] If pt mother chooses to not transport him in her [place]iece[place].  RN Case Manager Steward DroneBrenda [phone]) will f/u with pt family regarding HH orders.  Octavio GravesIsabel Mandalyn Pasqua, MSW, [facility] [place] Clinical Social Work [phone]

## 2018-10-15 NOTE — Consult Note (Signed)
WOC Nurse wound follow up Wound type:Left buttock wound and new partial thickness wound. Measurement: 3.3 cm x 1.4 with depth of 0.3 cm  New area: 0.5 cm x 0.5 cm x 0.3 cm  Bleeds with cleansing and more depth is appreciated today from existing site.  Mother at bedside and thinks the wound looks worse.  Wound CBS:WHQPRFF and ruddy red Drainage (amount, consistency, odor) moderate serosanguinous  No odor  Periwound: moist supple skin with thin Dressing procedure/placement/frequency: Discontinue hydrocolloid Cleanse wounds to left hip and buttock with soap and water.  Apply silicone foam dressing.  Change every 3 days and PRN soilage.  6 foam dressings sent home with mother for ongoing care. Will not follow at this time.  Please re-consult if needed.  [place]le [place] MSN, RN, FNP-BC CWON Wound, Ostomy, Continence Nurse Pager [phone]  Conservative sharp wound debridement (CSWD performed at the bedside):

## 2018-10-16 DIAGNOSIS — G9341 Metabolic encephalopathy: Secondary | ICD-10-CM

## 2018-10-16 LAB — GLUCOSE, CAPILLARY
GLUCOSE-CAPILLARY: 104 mg/dL — AB (ref 70–99)
Glucose-Capillary: 94 mg/dL (ref 70–99)
Glucose-Capillary: 100 mg/dL — ABNORMAL HIGH (ref 70–99)
Glucose-Capillary: 119 mg/dL — ABNORMAL HIGH (ref 70–99)
Glucose-Capillary: 108 mg/dL — ABNORMAL HIGH (ref 70–99)
Glucose-Capillary: 106 mg/dL — ABNORMAL HIGH (ref 70–99)

## 2018-10-16 NOTE — Care Management Note (Signed)
Case Management Note  Patient Details  Name: Jason Glover. MRN: [id number] Date of Birth: June 29, 1991  Subjective/Objective:   Pt admitted on 09/25/18 with seizure activity and fevers.  Pt with hx of CP and mitochondrial leigh syndrome.  He is normally communicative and able to use a motorized WC, but has not spoken since the seizures.  Pt from [place], [place] in [place].  Action/Plan: Met with mother to discuss home needs.  PEG tube replaced today, as it was displaced en route to [facility].  Mom states pt has hospital bed, WC and receives bolus feeds 4 times/ day.  Pt was able to eat prior to this hospitalization. Bedside RN to order dietician consult to assess caloric need for bolus vs nocturnal feeds, since pt unable to eat now.  Mom states she is in need of hoyer lift for home use.  Pt will need ambulance transport back to [facility] upon [employer]; RN Case Manager will arrange.   Expected Discharge Date:  10/16/18               Expected Discharge Plan:  [place]  In-House Referral:     Discharge planning Services  CM Consult  Post Acute Care Choice:  Home Health Choice offered to:  Parent, [facility] POA / Guardian  DME Arranged:  Tube feeding pump, Tube feeding DME Agency:  [place]:  RN, PT, Nurse's Aide [place] Agency:  [facility] ([facility])  Status of Service:  Completed, signed off  If discussed at [employer] of Stay Meetings, dates discussed:    Additional Comments:  10/16/18  J. Isbella Arline, RN, BSN Pt reaching medical stability; attempting to finalize discharge arrangements with pt's mother/legal guardian.  Mom states she has received Medicare.gov list, but didn't know what it was.  I made copy of list for her; and we reviewed the 4 choices for homecare together.  She states she has no preference, as she is not familiar with the agencies.  Mom plans to dc with pt to her sister's home at [place],  [place], [place] [address].  She is adamant that she had home enteral feedings prior to admission with [facility], though home health agency, does not have this on record.  They will pick up pt for services at discharge for home tube feedings and tube feeding pump.  She did ask "what do I do if I want to change agencies?  I may not like them."  Encouraged her to let [facility] agency know if she has issues or problems.   Her phone # is [phone], a different number from [facility].   Per bedside nurse, MD states pt needs PT evaluation prior to dc to determine if he can sit up in car for ride home, as ambulance transport will be private pay.   Await PT intervention and feedback.    Reinaldo Raddle, RN, BSN  Trauma/Neuro ICU Case Manager [phone]

## 2018-10-16 NOTE — Progress Notes (Addendum)
PROGRESS NOTE    Jason Glover.  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 At baseline able to communicate uses a motorized wheelchair. Admitted for further evaluation and treatment of focal status epilepticus and fever. Seen by neurology, stat EEG showed no epileptiform discharges and patient loaded with Vimpat. Patient followed by neurology, long-term EEG was negative but cannot rule out focal status epilepticus. Despite being no longer febrile, being treated with antibiotics and having extensive work-up as well as consultation with infectious disease in 3 antiepileptics as per neurology there is been no significant change in his condition.  Subjective:  Patient planned for discharge yesterday but transportation could not be arranged/ambulance not approved..  Mother bedside today for anticipated discharge.  Per mother patient's cognitive function not at baseline and usually able to give a thumbs up or blink in response to yes or no questions.  If discharged, she intends to take him home in her car.  Patient however has not been out of bed to chair during this hospitalization.  No further seizure activity.  Objective: Vitals:   10/16/18 0600 10/16/18 0800 10/16/18 1122 10/16/18 1533  BP:  121/70 105/74 105/67  Pulse:  (!) 119 (!) 105 (!) 112  Resp:  16 15 17   Temp:  99.3 F (37.4 C) 99.2 F (37.3 C) 99.1 F (37.3 C)  TempSrc:  Axillary Axillary Axillary  SpO2:  99% 100% 100%  Weight: 50.1 kg     Height:        Intake/Output Summary (Last 24 hours) at 10/16/2018 1549 Last data filed at 10/16/2018 1200 Gross per 24 hour  Intake 1830 ml  Output 1850 ml  Net -20 ml   Filed Weights   10/14/18 0500 10/15/18 0500 10/16/18 0600  Weight: 50.8 kg 49.1 kg 50.1 kg    Physical Examination:  General exam: Appears calm and comfortable  Respiratory system: Clear to auscultation. Respiratory effort normal. Cardiovascular system: S1 & S2 heard, RRR. No JVD, murmurs, rubs, gallops or clicks. No pedal edema. Gastrointestinal system: Abdomen is nondistended, soft and nontender. No organomegaly or masses felt. Normal bowel sounds heard. Central nervous system: Has cerebral palsy at baseline.  Disoriented, looking around in the room and blinking spontaneously but not following commands.  Moves upper extremities spontaneously Extremities: Symmetric 5 x 5 power.  Lower extremity contractures at baseline Skin: Stage II left trochanteric pressure ulcer,stage I ulcers left lateral malleolus right foot Psychiatry: Appears calm, could not assess further   Data Reviewed: I have personally reviewed following labs and imaging studies  CBC: Recent Labs  Lab 10/13/18 0559 10/14/18 0551 10/15/18 0322  WBC 9.3 7.4 9.0  HGB 10.3* 10.7* 11.5*  HCT 32.0* 33.6* 35.9*  MCV 88.2 88.9 88.2  PLT 377 397 431*   Basic Metabolic Panel: Recent Labs  Lab 10/11/18 0437 10/11/18 2101 10/13/18 0559 10/14/18 0551 10/15/18 0322  NA 139 137 136 137 136  K 4.2 3.5 3.6 4.0 4.2  CL 112* 112* 109 109 108  CO2 19* 16* 17* 19* 19*  GLUCOSE 87 119* 119* 116* 119*  BUN 19 25* 17 18 19   CREATININE 0.39* 0.46* 0.44* 0.52* 0.52*  CALCIUM 8.6* 8.5* 8.7* 9.0 9.3   GFR: Estimated Creatinine Clearance: 97.4 mL/min (A) (by C-G formula based on SCr of  0.52 mg/dL (L)). Liver Function Tests: Recent Labs  Lab 10/11/18 0437 10/11/18 2101 10/13/18 0559 10/13/18 2155 10/15/18 0322  AST 84* 93* 67* 42* 36  ALT 202* 204* 176* 141* 123*  ALKPHOS 107 113 118 111 117  BILITOT 0.3 0.4 0.4 0.4 0.2*  PROT 6.0* 6.0* 6.0* 6.2* 6.8  ALBUMIN 2.4* 2.3* 2.4* 2.4* 2.6*   No results for input(s): LIPASE, AMYLASE in the  last 168 hours. Recent Labs  Lab 10/14/18 0551  AMMONIA 29   Coagulation Profile: No results for input(s): INR, PROTIME in the last 168 hours. Cardiac Enzymes: Recent Labs  Lab 10/11/18 2101  TROPONINI <0.03   BNP (last 3 results) No results for input(s): PROBNP in the last 8760 hours. HbA1C: No results for input(s): HGBA1C in the last 72 hours. CBG: Recent Labs  Lab 10/15/18 2332 10/16/18 0445 10/16/18 0753 10/16/18 1124 10/16/18 1535  GLUCAP 89 94 100* 119* 108*   Lipid Profile: No results for input(s): CHOL, HDL, LDLCALC, TRIG, CHOLHDL, LDLDIRECT in the last 72 hours. Thyroid Function Tests: No results for input(s): TSH, T4TOTAL, FREET4, T3FREE, THYROIDAB in the last 72 hours. Anemia Panel: No results for input(s): VITAMINB12, FOLATE, FERRITIN, TIBC, IRON, RETICCTPCT in the last 72 hours. Sepsis Labs: No results for input(s): PROCALCITON, LATICACIDVEN in the last 168 hours.  No results found for this or any previous visit (from the past 240 hour(s)).    Radiology Studies: No results found.      Scheduled Meds: . free water  150 mL Per Tube Q8H  . lacosamide  200 mg Per Tube BID  . levETIRAcetam  1,500 mg Per Tube BID  . levOCARNitine  250 mg Per Tube Daily  . metoprolol tartrate  12.5 mg Per Tube BID  . nutrition supplement (JUVEN)  1 packet Per Tube BID BM   Continuous Infusions: . sodium chloride 10 mL/hr at 10/11/18 1135  . feeding supplement (JEVITY 1.2 CAL) 80 mL/hr at 10/15/18 0800    Assessment & Plan:    1.  Altered mental status/metabolic encephalopathy: Could be prolonged postictal state versus medication induced (on 3 antiepileptics) with underlying cerebral palsy.  Patient not safe to be transported in the backseat of a car.  Requested physical therapy evaluation and out of bed to chair while here.  May need rehab versus home physical therapy/ambulance transport.  2.  Seizures/status epilepticus:   Overnight on 1/5, had seizure-like  spells with associated arrhythmia, seen by neurology, felt Vimpat dose was not being administered correctly.-Due to transaminitis, discontinued Dilantin, LFTs are improving.  Ammonia normal.Neurology recommended continue Keppra, Vimpat and thiamine. Initial prolonged EEG with no evidence of seizures. EEG 1/6, moderately abnormal continuous video due to generalized background slowing with some runs of GRDA, no definitive epileptiform discharges.  3. Stage II left trochanteric pressure ulcer,stage I ulcers left lateral malleolus right foot Continue local Wound care   4. Mitochondrial leigh syndrome Continue levocarnitine  5.PEG tube malfunction IR was consulted and gastrostomy tube was exchanged on 12/31  6. Angioedema Resolved, patient developed angioedema on 12/27 with upper lip, right earlobe edema after receiving IV Ativan.  Can downgrade to Med-Surg bed   DVT prophylaxis: SCD Code Status: Full Family / Patient Communication: D/W mother , bedside nurse and case manager Disposition Plan:  Home when safe/cleared by PT     LOS: 21 days    Time spent: 35 minutes    Alessandra BevelsNeelima Diany Formosa, MD [facility] Pager [phone]  If 7PM-7AM, please contact night-coverage  [email or web address] Password [email or web address] 10/16/2018, 3:49 PM

## 2018-10-17 LAB — GLUCOSE, CAPILLARY
GLUCOSE-CAPILLARY: 102 mg/dL — AB (ref 70–99)
GLUCOSE-CAPILLARY: 121 mg/dL — AB (ref 70–99)
Glucose-Capillary: 87 mg/dL (ref 70–99)

## 2018-10-17 NOTE — Evaluation (Signed)
Physical Therapy Evaluation Patient Details Name: Jason Glover. MRN: [id number] DOB: [birth year] Today's Date: 10/17/2018   History of Present Illness  28 yo with PMH mitochondrial Leigh syndrome, pressure ulcers, feeding tubes placement, transferred from [facility] after presenting with acute encephalopathy, fever and uncontrollable movements right upper extremity. Pt post EEG without seizure treated by neurology and infectious disease without significant change  Clinical Impression  Pt lethargic on arrival with eyes closed, will arouse to his name called and touch. NO verbalizations throughout session or command following. Pt total assist for all mobility and balance and unable to maintain trunk or neck control in sitting. Per mom he was able to pivot on his own at baseline but has not done so for 2 months. She states he has had 3 periods of similar illness and always recovered. Pt has manual WC at home with power chair at school and per mom it does not have head or trunk supports. Pt may need new WC but without it present to assess will defer to further seating sessions at home. Pt with decreased strength, function and mobility who will benefit from trial of acute therapy to maximize mobility and ROM. Mom encouraged to perform ROM daily and reports understanding. Pt does not have trunk control to safely be transported in regular vehicle seat at this time.     Follow Up Recommendations Supervision/Assistance - 24 hour    Equipment Recommendations  Other (comment)(hoyer lift, pt possibly needs new wC fitting)    Recommendations for Other Services       Precautions / Restrictions Precautions Precautions: Fall Precaution Comments: bil LE contracture, no neck or trunk control      Mobility  Bed Mobility Overal bed mobility: Needs Assistance Bed Mobility: Rolling;Supine to Sit;Sit to Supine Rolling: Total assist   Supine to sit: Total assist;+2 for physical  assistance Sit to supine: Total assist;+2 for physical assistance   General bed mobility comments: total assist to roll right , total assist to rise and lower from surface with no active movement beyond slight movement of hands with mobility.   Transfers                 General transfer comment: mom deferred transferring pt to chair at this time as would be total lift and no trunk control to remain positioning in chair  Ambulation/Gait             General Gait Details: unable  Stairs            Wheelchair Mobility    Modified Rankin (Stroke Patients Only)       Balance Overall balance assessment: Needs assistance   Sitting balance-Leahy Scale: Zero Sitting balance - Comments: max assist EOB with flexed trunk and posterior lean                                     Pertinent Vitals/Pain Pain Assessment: No/denies pain    Home Living Family/patient expects to be discharged to:: Private residence Living Arrangements: Parent Available Help at Discharge: Available 24 hours/day Type of Home: House Home Access: Ramped entrance     Home Layout: One level Home Equipment: Hospital bed;Wheelchair - Engineer, technical sales - power Additional Comments: power chair is at school, manual chair at home. Pt was transfering to chair>car>to school>to power chair    Prior Function Level of Independence: Needs assistance   Gait / Transfers  Assistance Needed: PTA pt was supervision for stand pivot transfers from bed to chair, chair to toilet and to car  ADL's / Homemaking Assistance Needed: mother does all homemaking, pt used briefs and toileted at times  Comments: pt would use and ipad and was able to perform some self-feeding     Hand Dominance        Extremity/Trunk Assessment   Upper Extremity Assessment Upper Extremity Assessment: RUE deficits/detail;LUE deficits/detail RUE Deficits / Details: no AROM with wrist contracture, decreased elbow  flexion LUE Deficits / Details: no AROM with wrist contracture, decreased elbow flexion    Lower Extremity Assessment Lower Extremity Assessment: RLE deficits/detail;LLE deficits/detail RLE Deficits / Details: inverted contracted ankle with knee contracture grossly 30 degrees, hip ROM grossly functional, no AROM LLE Deficits / Details: knee contracture grossly 30 degrees, grossly wNF ankle ROM, not full hip extension, no AROM    Cervical / Trunk Assessment Cervical / Trunk Assessment: Kyphotic;Other exceptions Cervical / Trunk Exceptions: no trunk or neck control with right lateral neck flexion and trunk flexion in sitting  Communication   Communication: Expressive difficulties  Cognition Arousal/Alertness: Lethargic Behavior During Therapy: Flat affect Overall Cognitive Status: Difficult to assess                                 General Comments: Pt lethargic with eyes opening with arousal and name calling, nonverbal, no command following      General Comments      Exercises     Assessment/Plan    PT Assessment Patient needs continued PT services  PT Problem List Decreased range of motion;Decreased mobility;Decreased activity tolerance;Decreased cognition;Decreased coordination;Decreased balance;Decreased strength;Impaired tone;Decreased skin integrity       PT Treatment Interventions DME instruction;Functional mobility training;Balance training;Patient/family education;Therapeutic activities;Therapeutic exercise    PT Goals (Current goals can be found in the Care Plan section)  Acute Rehab PT Goals Patient Stated Goal: have him able to move PT Goal Formulation: With family Time For Goal Achievement: 10/31/18 Potential to Achieve Goals: Poor    Frequency Min 1X/week   Barriers to discharge        Co-evaluation               AM-PAC PT "6 Clicks" Mobility  Outcome Measure Help needed turning from your back to your side while in a flat bed  without using bedrails?: Total Help needed moving from lying on your back to sitting on the side of a flat bed without using bedrails?: Total Help needed moving to and from a bed to a chair (including a wheelchair)?: Total Help needed standing up from a chair using your arms (e.g., wheelchair or bedside chair)?: Total Help needed to walk in hospital room?: Total Help needed climbing 3-5 steps with a railing? : Total 6 Click Score: 6    End of Session   Activity Tolerance: No increased pain;Patient limited by lethargy Patient left: in bed;with call bell/phone within reach;with family/visitor present;Other (comment)(pt positioned with pillows) Nurse Communication: Mobility status;Need for lift equipment PT Visit Diagnosis: Other abnormalities of gait and mobility (R26.89);Muscle weakness (generalized) (M62.81)    Time: 1610-9604 PT Time Calculation (min) (ACUTE ONLY): 37 min   Charges:   PT Evaluation $PT Eval Moderate Complexity: 1 Mod          Scarleth Brame Abner Greenspan, PT Acute Rehabilitation Services Pager: [phone] Office: [phone]   Enedina Finner Michiah Masse 10/17/2018, 12:49 PM

## 2018-10-17 NOTE — Progress Notes (Signed)
Case management was able to get another transportation service to take patient home. Mom will ride in the front seat of the ambulance for transport.   Case management called to alert that transportation will be here @ 1500.

## 2018-10-17 NOTE — Progress Notes (Signed)
Nurse called [facility] after speaking to Dr. Catha Gosselin, [facility] states there is nothing that can be done to waive the up front cost of transportation over 50 miles.   Family is from [place] and going to [place], [place].  Mother does have a handicap accessible Zenaida Niece (for patient) here at the hospital as she had to follow the ambulance here.   Will discuss with case management during progression to speak to mom regarding.

## 2018-10-17 NOTE — Care Management Note (Signed)
Case Management Note  Patient Details  Name: Jason Glover. MRN: [id number] Date of Birth: [birth year]  Subjective/Objective:   Pt admitted on 09/25/18 with seizure activity and fevers.  Pt with hx of CP and mitochondrial leigh syndrome.  He is normally communicative and able to use a motorized WC, but has not spoken since the seizures.  Pt from [place], [place] in [place].  Action/Plan: Met with mother to discuss home needs.  PEG tube replaced today, as it was displaced en route to [facility].  Mom states pt has hospital bed, WC and receives bolus feeds 4 times/ day.  Pt was able to eat prior to this hospitalization. Bedside RN to order dietician consult to assess caloric need for bolus vs nocturnal feeds, since pt unable to eat now.  Mom states she is in need of hoyer lift for home use.  Pt will need ambulance transport back to [facility] [employer]; RN Case Manager will arrange.   Expected Discharge Date:  10/16/18               Expected Discharge Plan:  [place]  In-House Referral:     Discharge planning Services  CM Consult  Post Acute Care Choice:  Home Health Choice offered to:  Parent, [facility] POA / Guardian  DME Arranged:  Tube feeding pump, Tube feeding DME Agency:  [place]:  RN, PT, Nurse's Aide Painter Agency:  [facility] (now [facility])  Status of Service:  Completed, signed off  If discussed at [employer] of Stay Meetings, dates discussed:    Additional Comments:  10/16/18  J. Harpreet Signore, RN, BSN Pt reaching medical stability; attempting to finalize discharge arrangements with pt's mother/legal guardian.  Mom states she has received Medicare.gov list, but didn't know what it was.  I made copy of list for her; and we reviewed the 4 choices for homecare together.  She states she has no preference, as she is not familiar with the agencies.  Mom plans to dc with pt to her sister's home at [place],  [place], [place] [address].  She is adamant that she had home enteral feedings prior to admission with [facility], though home health agency, does not have this on record.  They will pick up pt for services at discharge for home tube feedings and tube feeding pump.  She did ask "what do I do if I want to change agencies?  I may not like them."  Encouraged her to let [facility] agency know if she has issues or problems.   Her phone # is [phone], a different number from [place].   Per bedside nurse, MD states pt needs PT evaluation prior to dc to determine if he can sit up in car for ride home, as ambulance transport will be private pay.   Await PT intervention and feedback.    10/17/17 J.Delainee Tramel, RN, BSN Pt evaluated by PT this AM; found to be unsafe to ride in private vehicle for transport home, per therapist.  Nutritional therapist; they will transport pt to dc address, and state this will be covered by pt's insurance, based on medical necessity.  I spoke with pt's mother and she is agreeable to transport by [facility].  Ambulance transport scheduled for 3:00pm pickup; packet prepared and left in pt's chart at front desk.  [facility] states pt's mother can ride in ambulance home, if she desires.  She was made  aware of this.   [place] agency and DME agency made aware of dc home today.   Reinaldo Raddle, RN, BSN  Trauma/Neuro ICU Case Manager [phone]

## 2018-10-17 NOTE — Discharge Summary (Signed)
Physician Discharge Summary   Discharge summary done by Dr. Madelyn Brunner on 10/15/2018. Patient was not discharged on 10/15/2018 due to transportation issues. Patient stable for discharge. Discharge medications and summary done. No changes.   Jason Glover D.O. [facility] Pager [phone]  If 7PM-7AM, please contact night-coverage [email or web address] Password [facility] 10/17/2018, 7:49 AM    Patient ID: Jason Glover. MRN: [id number] DOB/AGE: 07-17-1991 28 y.o.  Admit date: 09/25/2018 Discharge date: 10/17/2018  Primary Care Physician:  Johny Blamer, MD   Recommendations for Outpatient Follow-up:  1. Follow up with PCP in 1-2 weeks 2. Follow LFTs in 1 week 3. Wound care as per discharge instructions  Home Health: Home health HHA, RN, PT Equipment/Devices:   Discharge Condition: Guarded CODE STATUS: FULL  Diet recommendation: Tube feeds   Discharge Diagnoses:    Principal discharge diagnosis Focal status epilepticus, seizures with acute metabolic encephalopathy  Secondary diagnosis Cerebral palsy with mitochondrial leigh syndrome Transaminitis Severe protein calorie malnutrition FUO Sinus tachycardia Angioedema PEG tube malfunction Stage II left trochanteric pressure ulcer Stage I ulcers left lateral malleolus right foot  Consults: Neurology Infectious disease   Allergies:   Allergies  Allergen Reactions  . Ativan [Lorazepam]     Facial swelling  . Rocephin [Ceftriaxone] Hives     DISCHARGE MEDICATIONS: Allergies as of 10/17/2018      Reactions   Ativan [lorazepam]    Facial swelling   Rocephin [ceftriaxone] Hives      Medication List    STOP taking these medications   B COMPLETE Tabs   ferrous sulfate 325 (65 FE) MG EC tablet   L-Carnitine 250 MG Caps   polyethylene glycol packet Commonly known as:  MIRALAX / GLYCOLAX     TAKE these medications   diphenhydrAMINE 12.5 MG/5ML elixir Commonly known as:  BENADRYL Place 10 mLs (25 mg  total) into feeding tube every 6 (six) hours as needed for itching or allergies.   feeding supplement (JEVITY 1.2 CAL) Liqd Place 1,000 mLs into feeding tube continuous.   free water Soln Place 150 mLs into feeding tube every 8 (eight) hours.   lacosamide 200 MG Tabs tablet Commonly known as:  VIMPAT Place 1 tablet (200 mg total) into feeding tube 2 (two) times daily.   levETIRAcetam 100 MG/ML solution Commonly known as:  KEPPRA Place 15 mLs (1,500 mg total) into feeding tube 2 (two) times daily.   levOCARNitine 1 GM/10ML solution Commonly known as:  CARNITOR Place 2.5 mLs (250 mg total) into feeding tube daily.   metoprolol tartrate 25 mg/10 mL Susp Commonly known as:  LOPRESSOR Place 5 mLs (12.5 mg total) into feeding tube 2 (two) times daily.   thiamine 100 MG tablet Commonly known as:  VITAMIN B-1 Take 1 tablet (100 mg total) by mouth daily.            Durable Medical Equipment  (From admission, onward)         Start     Ordered   10/16/18 1236  For home use only DME Tube feeding pump  Once     10/16/18 1235   10/16/18 1235  For home use only DME Tube feeding  Once    Comments:  Upon discharge home,   Recommend nocturnal tube feedings using new formula of Jevity or equivalent 1.5 formula via PEG at goal rate of 90 ml x 16 hours (5pm-9am).  Recommend 30 ml Promod (or equivalent) once daily per tube.  Recommend free water flushes of 200  ml given 5 times daily per tube. Nocturnal tube feeds to provide 2260 kcal (100% of needs), 107 grams of protein, and 2094 ml water.   10/16/18 1235           Brief H and P: For complete details please refer to admission H and P, but in briefPatient is a 28 year old man PMH mitochondrial Leigh syndrome, pressure ulcers, feeding tubes placement, transferred from [facility] after presenting with acute encephalopathy, fever and uncontrollable movements right upper extremity. At baseline able to communicate  uses a motorized wheelchair. Admitted for further evaluation and treatment of focal status epilepticus and fever. Seen by neurology, stat EEG showed no epileptiform discharges and patient loaded with Vimpat. Patient followed by neurology, long-term EEG was negative but cannot rule out focal status epilepticus. Despite being no longer febrile, being treated with antibiotics and having extensive work-up as well as consultation with infectious disease in 3 antiepileptics as per neurology there is been no significant change in his condition.   Hospital Course:  Focal status epilepticus, seizures, acute metabolic encephalopathy with underlying CP. -Initial EG and prolonged EEG showed no evidence of seizures, patient was started on thiamine per neurology -MRI brain showed restricted diffusion left frontal cortex which could represent ongoing seizures. -Overnight on 1/5, had seizure-like spells with associated arrhythmia, seen by neurology, felt Vimpat dose was not being administered correctly. -Neurology recommended continue Keppra, Vimpat and thiamine -Due to transaminitis, discontinued, LFTs are improving.  Ammonia normal. - EEG 1/6, moderately abnormal continuous video due to generalized background slowing with some runs of GRDA, no definitive epileptiform discharges. -Discussed with patient's mother at the bedside, patient is at baseline mental status.  No significant change from yesterday and no further seizure-like activities.  Cleared by neurology for discharge.  Transaminitis -Continue to hold Dilantin, LFTs improving. - doubt secondary to tube feeds as patient has been on long-term artificial feeding.  Severe protein calorie malnutrition -Continues feeding per home regimen  FUO -On admission, treated empirically with acyclovir, aztreonam and vancomycin. -Mother had declined LP, chest x-ray, UA negative, influenza negative, subsequently antibiotics were discontinued.  ID was  consulted. -Blood cultures remain negative, felt possibly due to antiepileptics or drug fever or central fever from Leigh syndrome -Consulted and recommended stopping antibiotics and monitoring fever curve, has remained stable  Sinus tachycardia -Patient had sinus tachycardia during this hospitalization -Heart rate better, continue metoprolol  -EKG changes, troponin was less than 0.03, no complaints of any chest pain. -2D echo showed EF of 60 to 65% with normal wall motion  Mitochondrial leigh syndrome Continue levocarnitine  PEG tube malfunction IR was consulted and gastrostomy tube was exchanged on 12/31  Angioedema Resolved, patient developed angioedema on 12/27 with upper lip, right earlobe edema after receiving IV Ativan  Stage II left trochanteric pressure ulcer, stage I ulcers left lateral malleolus right foot Wound care instructions placed    Day of Discharge S: No seizure-like activity overnight, mother hoping to get patient home today  BP 101/76   Pulse 98   Temp 99 F (37.2 C) (Oral)   Resp 17   Ht 5\' 9"  (1.753 m)   Wt 50.1 kg   SpO2 100%   BMI 16.31 kg/m   Physical Exam: General: Sleepy, per mother was watching TV, close to his baseline HEENT: anicteric sclera, pupils reactive to light and accommodation CVS: S1-S2 clear no murmur rubs or gallops Chest: clear to auscultation bilaterally, no wheezing rales or rhonchi Abdomen: soft nontender, nondistended, normal bowel  sounds Extremities: no cyanosis, clubbing or edema noted bilaterally, contractures  neuro: Unable to assess   The results of significant diagnostics from this hospitalization (including imaging, microbiology, ancillary and laboratory) are listed below for reference.      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 Electronically Signed   By: Awilda Metro M.D.   On: 09/26/2018 23:29   Result Date: 09/26/2018 CLINICAL DATA:  Encephalopathy. History of Leigh syndrome/mitochondrial disorder. EXAM: MRI HEAD WITHOUT AND WITH CONTRAST TECHNIQUE: Multiplanar, multiecho pulse sequences of the brain and surrounding structures were obtained without and with intravenous contrast. CONTRAST:  4 cc Gadavist COMPARISON:  MRI head November 06, 2005 FINDINGS: Moderately motion degraded examination. INTRACRANIAL CONTENTS: Rounded reduced diffusion LEFT frontal cortex with T2 shine and central nonenhancing low ADC values with enhancement. Symmetric FLAIR T2 hyperintense signal and volume loss bilateral basal ganglia and hippocampi. Severe pontocerebellar atrophy, progressed from 2007. No midline shift, mass effect or masses. No hydrocephalus. No abnormal extra-axial fluid collections or extra-axial enhancement. VASCULAR: Normal major intracranial vascular flow voids present at skull base. SKULL AND UPPER CERVICAL SPINE: No abnormal sellar expansion. No suspicious calvarial bone marrow signal. Craniocervical junction maintained. SINUSES/ORBITS: Mild LEFT maxillary sinus mucosal thickening. Mastoid air cells are well aerated. Included ocular globes and orbital contents are non-suspicious. OTHER: None. IMPRESSION: 1. Moderately motion degraded examination. LEFT frontal cortical lesion with imaging characteristics of subacute Leigh lesion or other demyelination. 2. Symmetrically atrophic basal ganglia and hippocampi associated seen with Leigh syndrome though, other neurodegenerative syndromes can have this appearance. 3. Severe Pontocerebellar atrophy progressed from 2007. Electronically Signed: By: Awilda Metro M.D. On: 09/26/2018 23:19   Ir Replc Gastro/colonic Tube Percut W/fluoro  Result Date: 10/07/2018 INDICATION: Malfunctioning gastrostomy tube.  Plan for gastrostomy tube exchange EXAM: GASTROSTOMY TUBE EXCHANGE WITH FLUOROSCOPY MEDICATIONS: None  ANESTHESIA/SEDATION: None CONTRAST:  5 mL-administered into the gastric lumen. FLUOROSCOPY TIME:  Fluoroscopy Time 12 seconds, 0.4 mGy. COMPLICATIONS: None immediate. PROCEDURE: Informed written consent was obtained from the patient's mother after a thorough discussion of the procedural risks, benefits and alternatives. All questions were addressed. A timeout was performed prior to the initiation of the procedure. The existing catheter was prepped and draped. Old catheter balloon was no longer inflated and the tube was easily removed with traction. A new 16 French, 3 cm MIC-KEY button gastrostomy tube was easily advanced in the stomach. Balloon was inflated with 5 mL of saline. Contrast injection confirmed placement in the stomach. IMPRESSION: Successful exchange of the MIC-KEY button gastrostomy tube. Electronically Signed   By: Richarda Overlie M.D.   On: 10/07/2018 17:20   Dg Chest Port 1 View  Result Date: 09/26/2018 CLINICAL DATA:  Fever EXAM: PORTABLE CHEST 1 VIEW COMPARISON:  03/26/2010 FINDINGS: Heart and mediastinal contours are within normal limits. No focal opacities or effusions. No acute bony abnormality. IMPRESSION: No active disease. Electronically Signed   By: Charlett Nose M.D.   On: 09/26/2018 07:32   Dg Chest Port 1v Same Day  Result Date: 10/04/2018 CLINICAL DATA:  Acute onset of fever. EXAM: PORTABLE CHEST 1 VIEW COMPARISON:  Chest radiograph performed 09/26/2018 FINDINGS: The patient's enteric tube is seen extending about the antrum of the stomach. There is elevation of the right hemidiaphragm. The lungs appear grossly clear. No focal consolidation, pleural effusion or pneumothorax is seen. The cardiomediastinal silhouette is normal in size. No acute osseous  abnormalities are identified. Evaluation is mildly suboptimal due to patient rotation. IMPRESSION: No acute cardiopulmonary process seen; elevation of the right hemidiaphragm. Electronically Signed   By: Roanna RaiderJeffery  Chang M.D.   On:  10/04/2018 21:57   Dg Abd Portable 1v  Result Date: 09/29/2018 CLINICAL DATA:  Feeding tube placement EXAM: PORTABLE ABDOMEN - 1 VIEW COMPARISON:  None. FINDINGS: The tip of the weighted enteric tube projects in the expected location of the gastroduodenal junction. No dilated small bowel. There is severe left convex scoliosis. Visualized lung bases are clear. IMPRESSION: Tip of the weighted enteric tube projects in the expected location of the gastroduodenal junction. Electronically Signed   By: Deatra RobinsonKevin  Herman M.D.   On: 09/29/2018 16:58      LAB RESULTS: Basic Metabolic Panel: Recent Labs  Lab 10/14/18 0551 10/15/18 0322  NA 137 136  K 4.0 4.2  CL 109 108  CO2 19* 19*  GLUCOSE 116* 119*  BUN 18 19  CREATININE 0.52* 0.52*  CALCIUM 9.0 9.3   Liver Function Tests: Recent Labs  Lab 10/13/18 2155 10/15/18 0322  AST 42* 36  ALT 141* 123*  ALKPHOS 111 117  BILITOT 0.4 0.2*  PROT 6.2* 6.8  ALBUMIN 2.4* 2.6*   No results for input(s): LIPASE, AMYLASE in the last 168 hours. Recent Labs  Lab 10/14/18 0551  AMMONIA 29   CBC: Recent Labs  Lab 10/14/18 0551 10/15/18 0322  WBC 7.4 9.0  HGB 10.7* 11.5*  HCT 33.6* 35.9*  MCV 88.9 88.2  PLT 397 431*   Cardiac Enzymes: Recent Labs  Lab 10/11/18 2101  TROPONINI <0.03   BNP: Invalid input(s): POCBNP CBG: Recent Labs  Lab 10/16/18 2340 10/17/18 0400  GLUCAP 104* 87      Disposition and Follow-up: Discharge Instructions    Discharge instructions   Complete by:  As directed    Apply skin prep around the wound on the left trochanter.  Allow to air dry.  Place a hydrocolloid over the wound. Date the dressing. Change every 3 - 5 day and prn.   Increase activity slowly   Complete by:  As directed        DISPOSITION: Home with home health   DISCHARGE FOLLOW-UP Follow-up Information    Deetta PerlaHickling, [name] H, MD. Schedule an appointment as soon as possible for a visit in 10 day(s).   Specialties:  Pediatrics,  Radiology Why:  for hospital follow-up Contact information: [address] [place] [address] [phone]            Time coordinating discharge:  45-minutes  Signed:   Edsel PetrinMaryann Lasonja Lakins M.D. [facility] 10/17/2018, 7:47 AM Pager: [phone]

## 2018-10-17 NOTE — Progress Notes (Signed)
Spoke with Dr, Catha Gosselin, PT/OT to reevaluate for patient discharge. Patient is same baseline as upon admission. Please see note regarding Leigh's Syndrome and progressive atrophy.

## 2019-02-06 DEATH — deceased

## 2019-12-14 IMAGING — DX DG CHEST 1V PORT
1 series · 1 of 1 positions shown · non-contrast
Comparison: 03/26/2010

CLINICAL DATA: Fever

EXAM:
PORTABLE CHEST 1 VIEW

[chest ap]
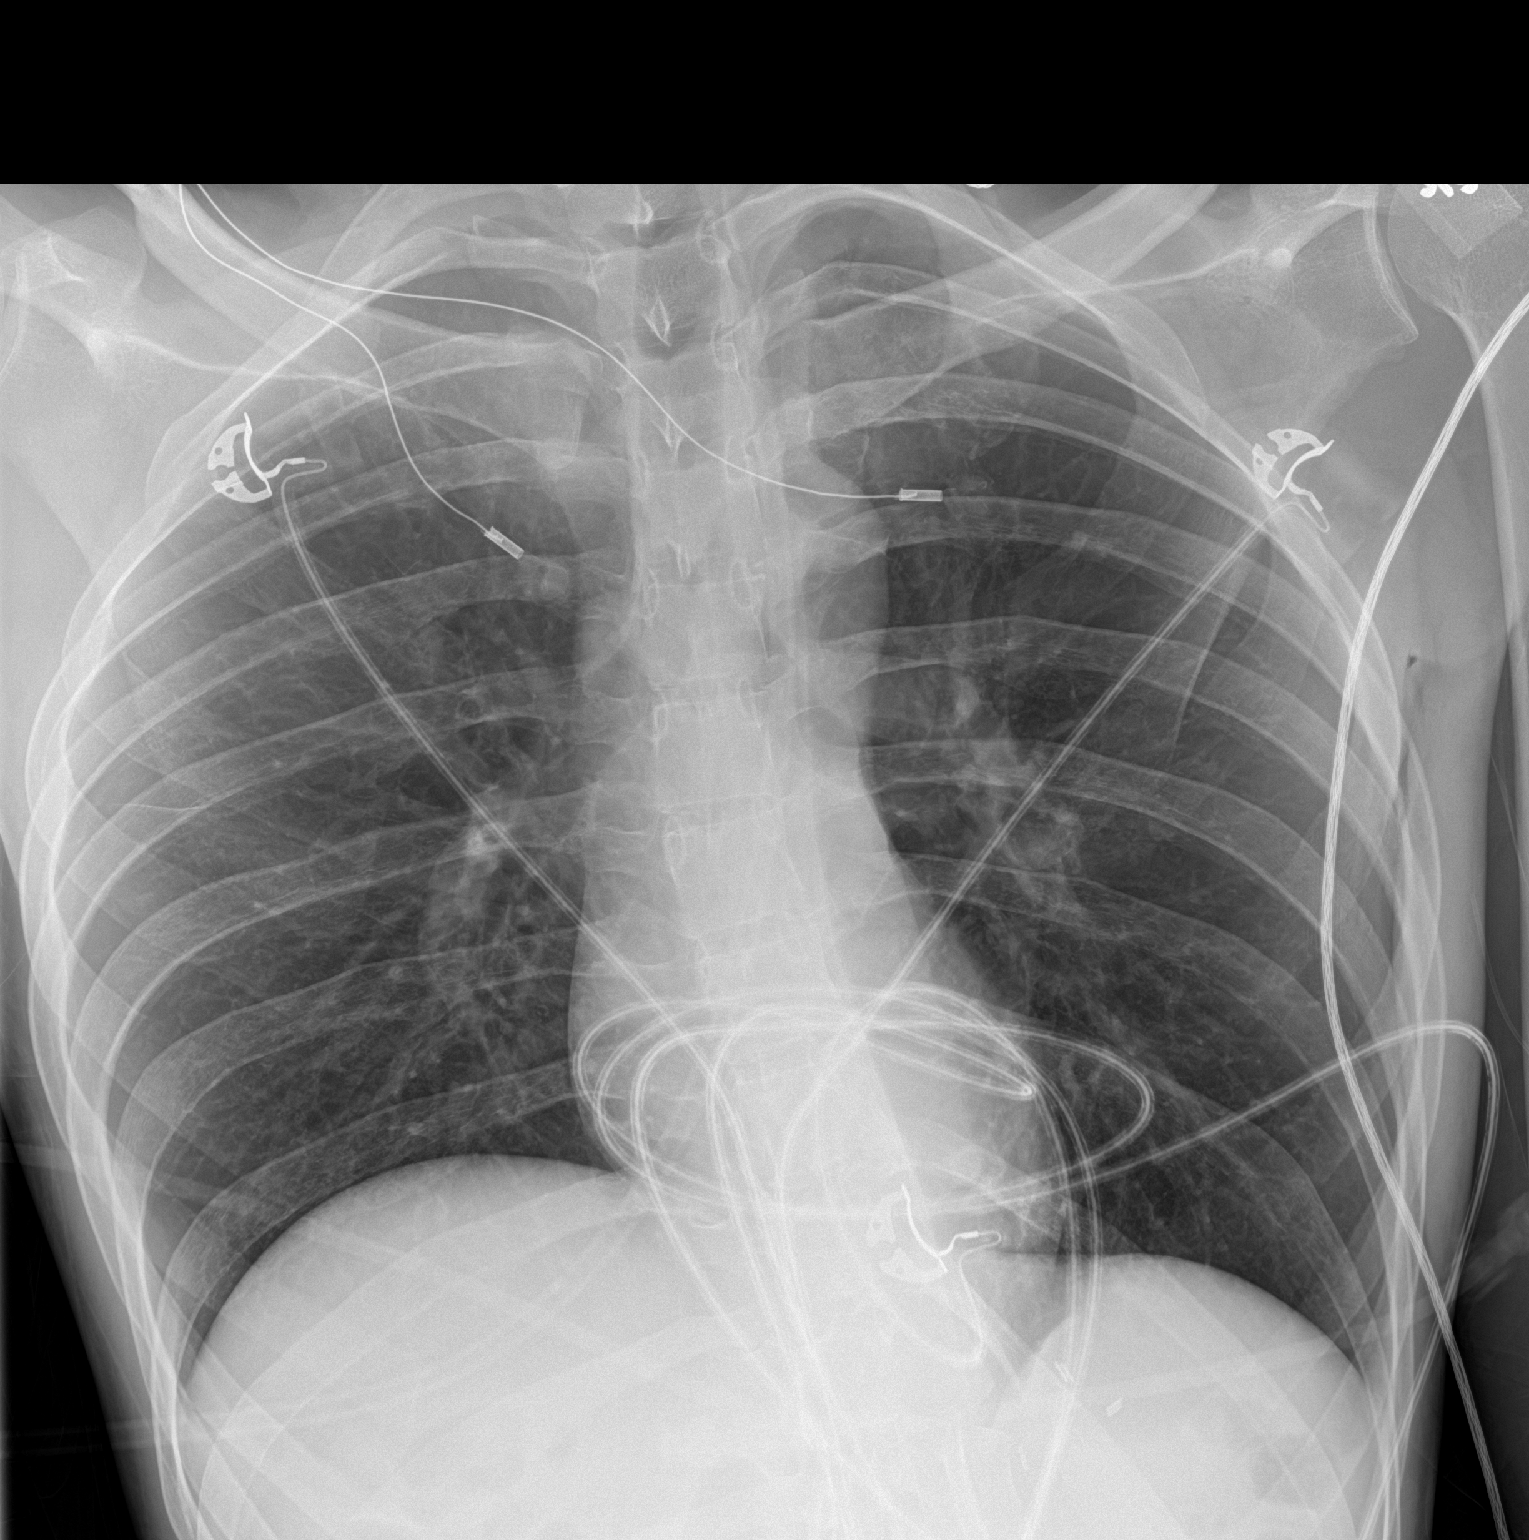

[1 of 1 positions shown; findings below may reference images not displayed]

FINDINGS: Heart and mediastinal contours are within normal limits. No focal
opacities or effusions. No acute bony abnormality.
IMPRESSION: No active disease.

## 2019-12-17 IMAGING — DX DG ABD PORTABLE 1V
1 series · 1 of 1 positions shown · non-contrast
Comparison: None.

CLINICAL DATA: Feeding tube placement

EXAM:
PORTABLE ABDOMEN - 1 VIEW

[abdomen]
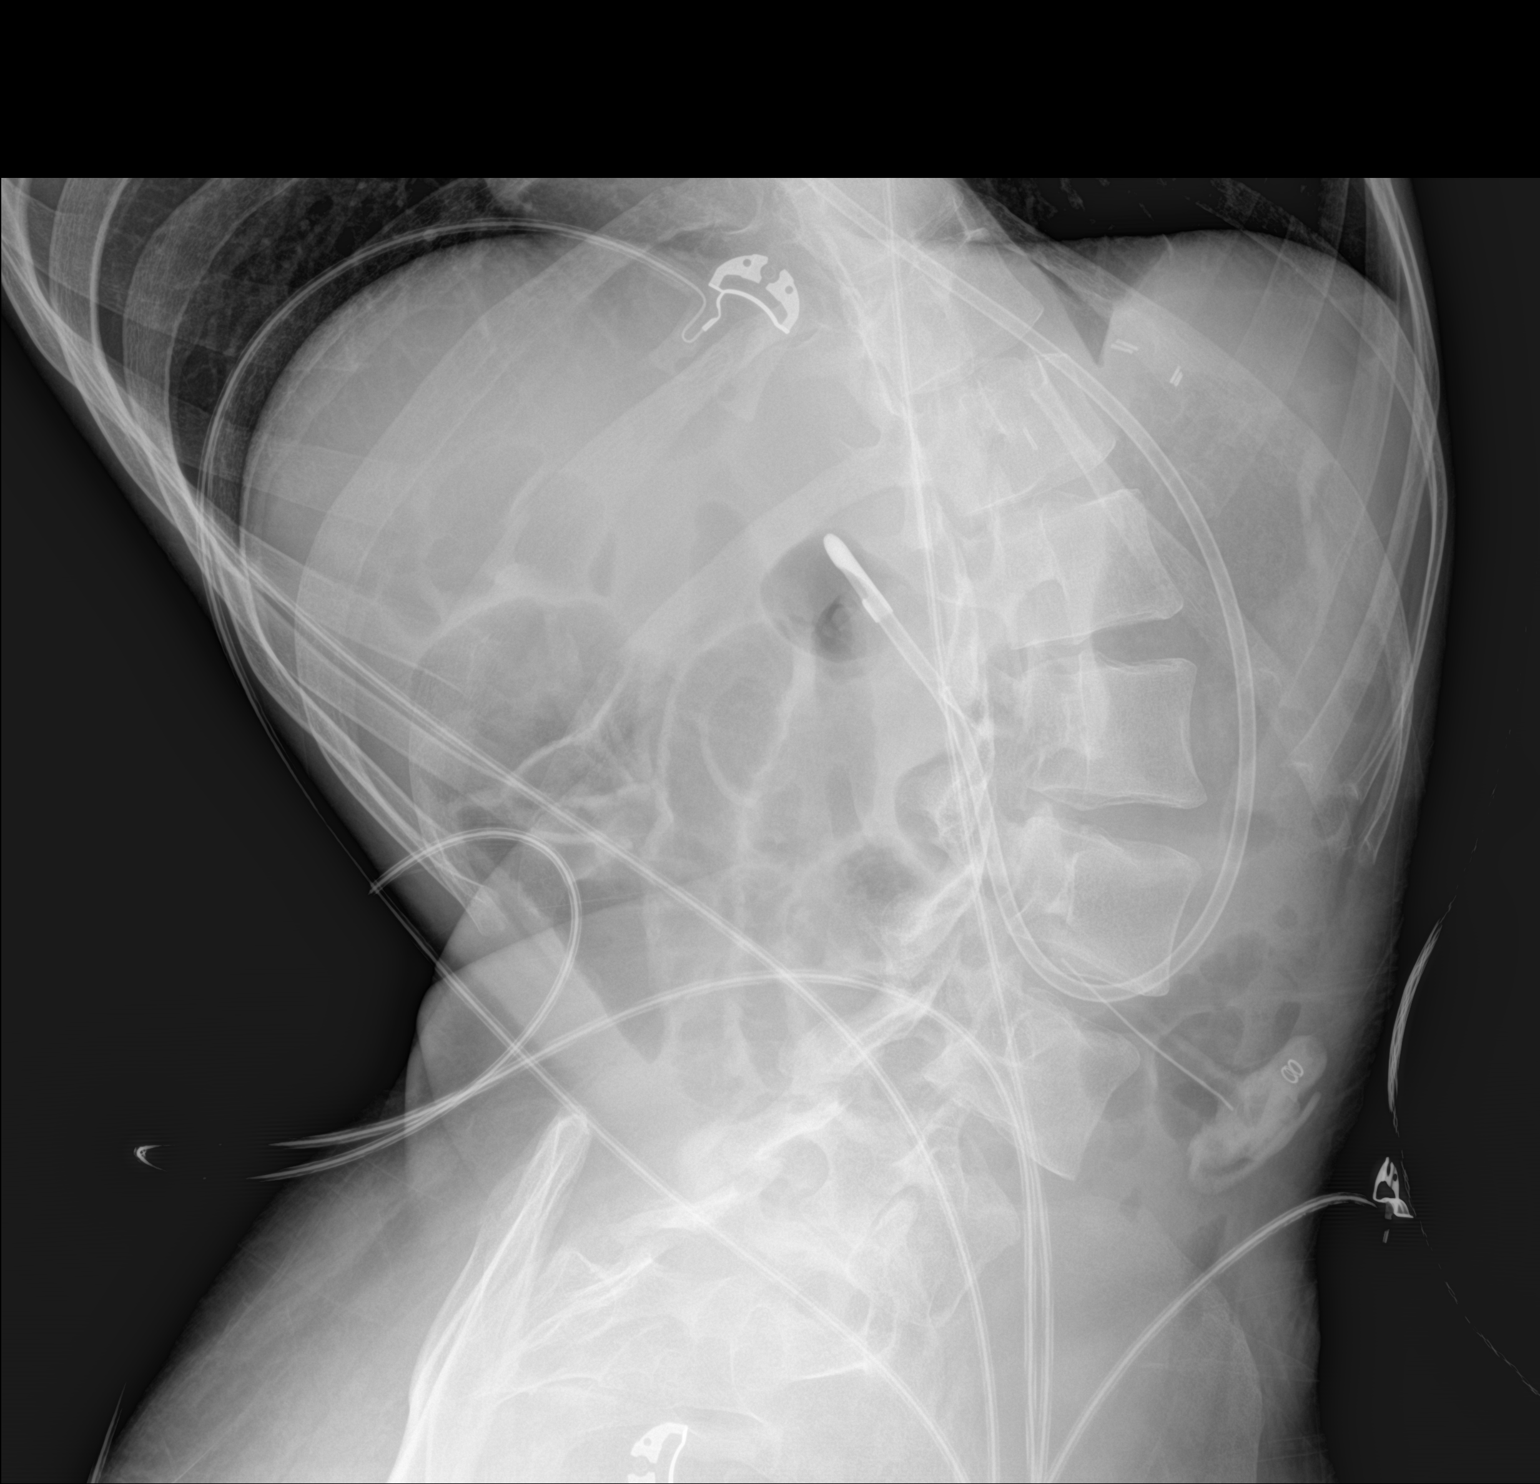

[1 of 1 positions shown; findings below may reference images not displayed]

FINDINGS: The tip of the weighted enteric tube projects in the expected
location of the gastroduodenal junction. No dilated small bowel.
There is severe left convex scoliosis. Visualized lung bases are
clear.
IMPRESSION: Tip of the weighted enteric tube projects in the expected location
of the gastroduodenal junction.

## 2019-12-22 IMAGING — DX DG CHEST 1V PORT SAME DAY
1 series · 1 of 1 positions shown · non-contrast
Comparison: Chest radiograph performed 09/26/2018

CLINICAL DATA: Acute onset of fever.

EXAM:
PORTABLE CHEST 1 VIEW

[chest ap]
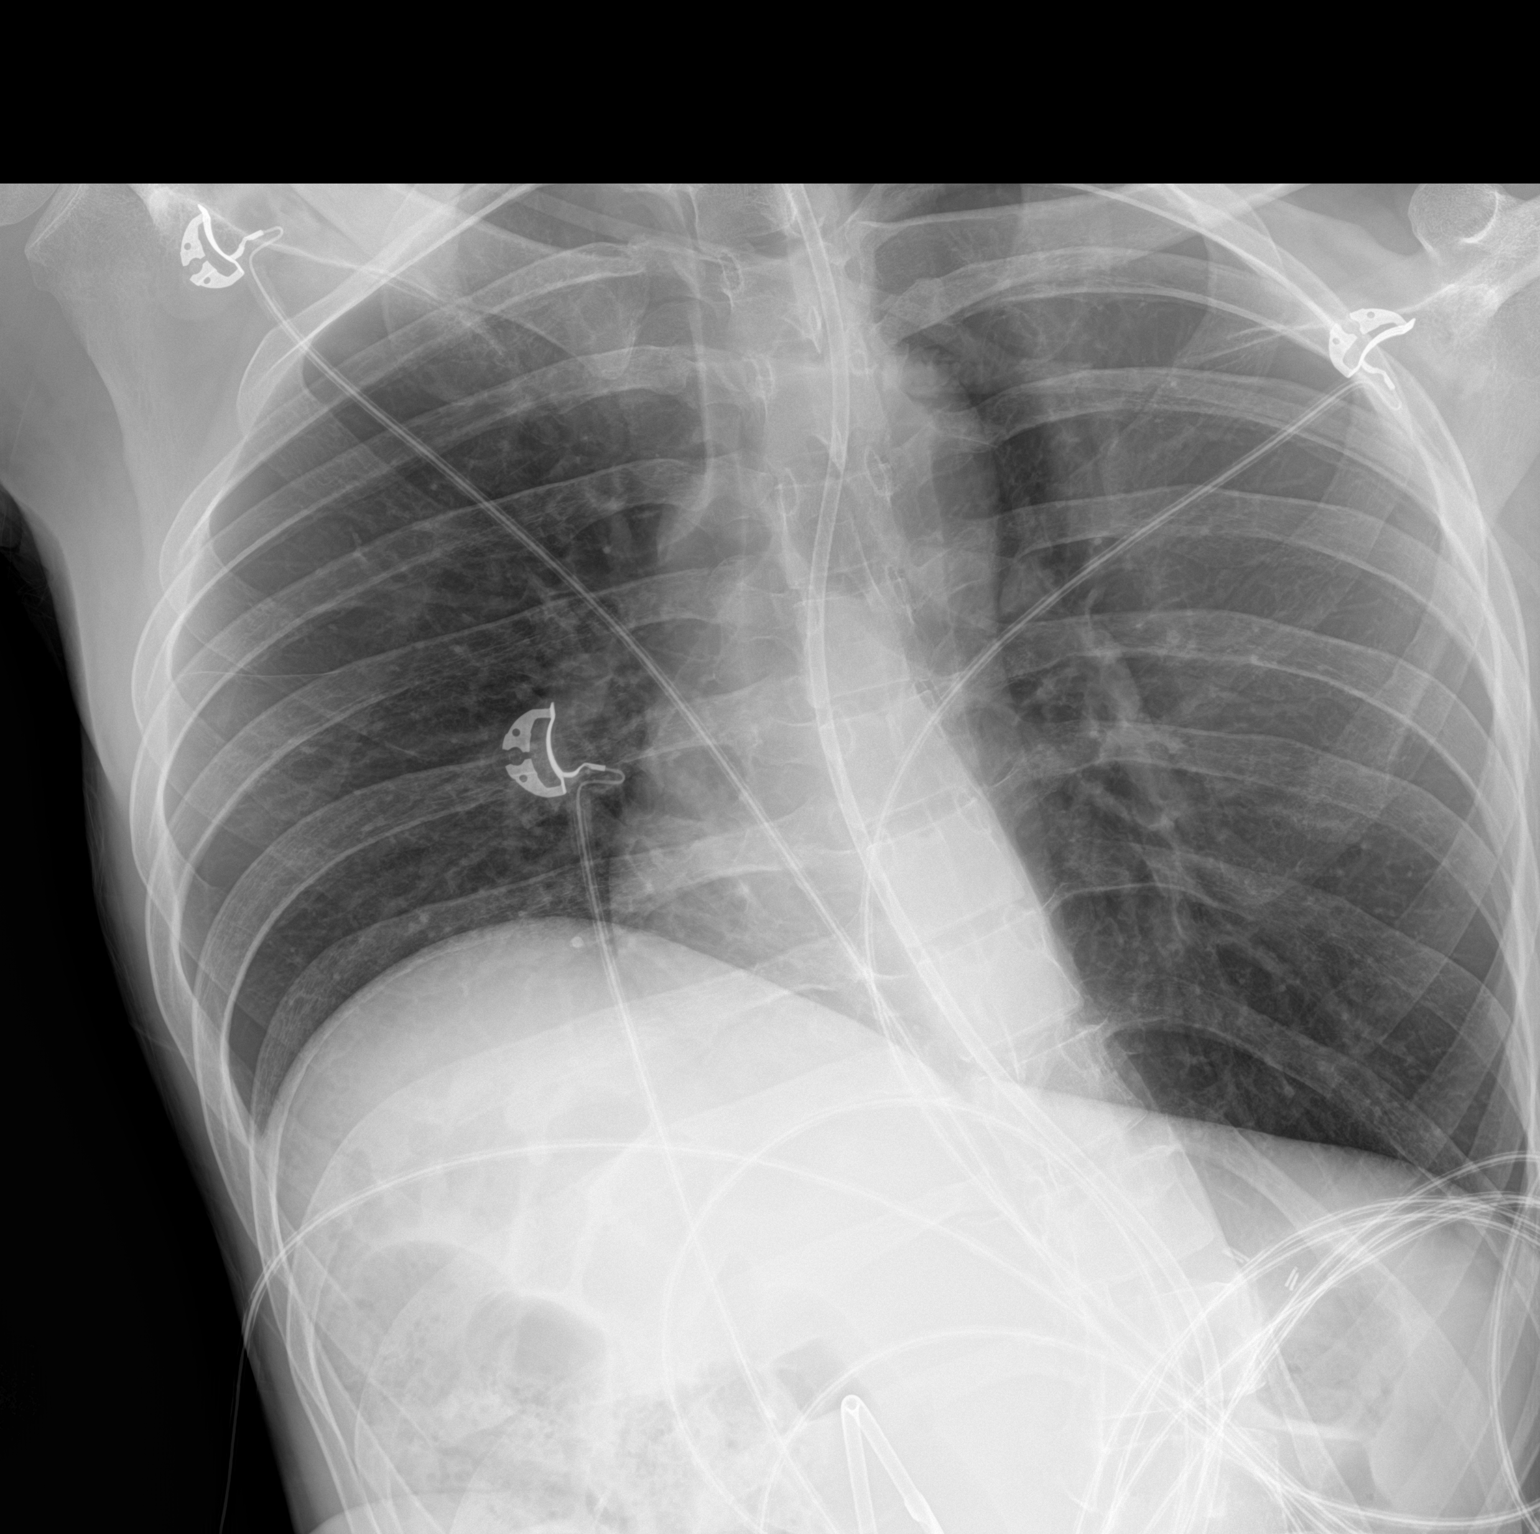

[1 of 1 positions shown; findings below may reference images not displayed]

FINDINGS: The patient's enteric tube is seen extending about the antrum of the
stomach.

There is elevation of the right hemidiaphragm. The lungs appear
grossly clear. No focal consolidation, pleural effusion or
pneumothorax is seen.

The cardiomediastinal silhouette is normal in size. No acute osseous
abnormalities are identified. Evaluation is mildly suboptimal due to
patient rotation.
IMPRESSION: No acute cardiopulmonary process seen; elevation of the right
hemidiaphragm.
